# Patient Record
Sex: Female | Born: 1960 | Race: Black or African American | Hispanic: No | Marital: Single | Smoking: Current every day smoker
Health system: Southern US, Community
[De-identification: ages and names within clinical notes are randomized; demographics above are authoritative.]

## PROBLEM LIST (undated history)

## (undated) ENCOUNTER — Emergency Department (HOSPITAL_COMMUNITY): Discharge status: Absent without leave | Payer: Self-pay

## (undated) DIAGNOSIS — I10 Essential (primary) hypertension: Secondary | ICD-10-CM

## (undated) DIAGNOSIS — T7840XA Allergy, unspecified, initial encounter: Secondary | ICD-10-CM

## (undated) HISTORY — PX: ABDOMINAL HYSTERECTOMY: SHX81

## (undated) HISTORY — DX: Allergy, unspecified, initial encounter: T78.40XA

---

## 2000-08-12 ENCOUNTER — Ambulatory Visit (HOSPITAL_COMMUNITY): Admission: RE | Admit: 2000-08-12 | Discharge: 2000-08-12 | Payer: Self-pay | Admitting: *Deleted

## 2000-09-25 ENCOUNTER — Encounter: Admission: RE | Admit: 2000-09-25 | Discharge: 2000-09-25 | Payer: Self-pay | Admitting: Obstetrics

## 2000-11-06 ENCOUNTER — Other Ambulatory Visit: Admission: RE | Admit: 2000-11-06 | Discharge: 2000-11-06 | Payer: Self-pay | Admitting: Obstetrics

## 2000-11-06 ENCOUNTER — Encounter: Admission: RE | Admit: 2000-11-06 | Discharge: 2000-11-06 | Payer: Self-pay | Admitting: Obstetrics

## 2000-11-14 ENCOUNTER — Ambulatory Visit (HOSPITAL_COMMUNITY): Admission: RE | Admit: 2000-11-14 | Discharge: 2000-11-14 | Payer: Self-pay | Admitting: *Deleted

## 2002-08-24 ENCOUNTER — Other Ambulatory Visit: Admission: RE | Admit: 2002-08-24 | Discharge: 2002-08-24 | Payer: Self-pay | Admitting: *Deleted

## 2005-07-31 ENCOUNTER — Other Ambulatory Visit: Admission: RE | Admit: 2005-07-31 | Discharge: 2005-07-31 | Payer: Self-pay | Admitting: *Deleted

## 2005-10-15 ENCOUNTER — Encounter (INDEPENDENT_AMBULATORY_CARE_PROVIDER_SITE_OTHER): Payer: Self-pay | Admitting: *Deleted

## 2005-10-16 ENCOUNTER — Inpatient Hospital Stay (HOSPITAL_COMMUNITY): Admission: RE | Admit: 2005-10-16 | Discharge: 2005-10-17 | Payer: Self-pay | Admitting: *Deleted

## 2006-02-04 ENCOUNTER — Ambulatory Visit (HOSPITAL_COMMUNITY): Admission: RE | Admit: 2006-02-04 | Discharge: 2006-02-04 | Payer: Self-pay | Admitting: *Deleted

## 2006-02-20 ENCOUNTER — Encounter: Admission: RE | Admit: 2006-02-20 | Discharge: 2006-02-20 | Payer: Self-pay | Admitting: *Deleted

## 2006-04-03 ENCOUNTER — Encounter: Admission: RE | Admit: 2006-04-03 | Discharge: 2006-04-03 | Payer: Self-pay | Admitting: *Deleted

## 2008-06-24 ENCOUNTER — Ambulatory Visit: Payer: Self-pay | Admitting: Family Medicine

## 2008-06-24 LAB — CONVERTED CEMR LAB
AST: 18 units/L (ref 0–37)
Basophils Absolute: 0 10*3/uL (ref 0.0–0.1)
Basophils Relative: 0 % (ref 0–1)
CO2: 24 meq/L (ref 19–32)
Chloride: 104 meq/L (ref 96–112)
Cholesterol: 231 mg/dL — ABNORMAL HIGH (ref 0–200)
Eosinophils Absolute: 0.1 10*3/uL (ref 0.0–0.7)
Eosinophils Relative: 2 % (ref 0–5)
Glucose, Bld: 85 mg/dL (ref 70–99)
HCT: 43.3 % (ref 36.0–46.0)
HDL: 65 mg/dL (ref >39)
LDL Cholesterol: 150 mg/dL — ABNORMAL HIGH (ref 0–99)
MCHC: 32.1 g/dL (ref 30.0–36.0)
MCV: 94.1 fL (ref 78.0–100.0)
Monocytes Absolute: 0.4 10*3/uL (ref 0.1–1.0)
Monocytes Relative: 8 % (ref 3–12)
Neutrophils Relative %: 61 % (ref 43–77)
Potassium: 4 meq/L (ref 3.5–5.3)
Sodium: 139 meq/L (ref 135–145)
TSH: 1.113 microintl units/mL (ref 0.350–4.50)
WBC: 4.5 10*3/uL (ref 4.0–10.5)

## 2008-06-28 ENCOUNTER — Ambulatory Visit (HOSPITAL_COMMUNITY): Admission: RE | Admit: 2008-06-28 | Discharge: 2008-06-28 | Payer: Self-pay | Admitting: Family Medicine

## 2008-07-01 ENCOUNTER — Ambulatory Visit: Payer: Self-pay | Admitting: *Deleted

## 2008-07-06 ENCOUNTER — Encounter: Admission: RE | Admit: 2008-07-06 | Discharge: 2008-07-06 | Payer: Self-pay | Admitting: Family Medicine

## 2008-08-30 ENCOUNTER — Ambulatory Visit: Payer: Self-pay | Admitting: Family Medicine

## 2008-08-30 ENCOUNTER — Encounter (INDEPENDENT_AMBULATORY_CARE_PROVIDER_SITE_OTHER): Payer: Self-pay | Admitting: Family Medicine

## 2009-03-05 ENCOUNTER — Emergency Department (HOSPITAL_COMMUNITY): Admission: EM | Admit: 2009-03-05 | Discharge: 2009-03-05 | Payer: Self-pay | Admitting: Emergency Medicine

## 2009-05-09 ENCOUNTER — Emergency Department (HOSPITAL_COMMUNITY): Admission: EM | Admit: 2009-05-09 | Discharge: 2009-05-09 | Payer: Self-pay | Admitting: Emergency Medicine

## 2009-07-29 ENCOUNTER — Emergency Department (HOSPITAL_COMMUNITY): Admission: EM | Admit: 2009-07-29 | Discharge: 2009-07-29 | Payer: Self-pay | Admitting: Emergency Medicine

## 2009-08-21 ENCOUNTER — Ambulatory Visit (HOSPITAL_COMMUNITY): Admission: RE | Admit: 2009-08-21 | Discharge: 2009-08-21 | Payer: Self-pay | Admitting: Family Medicine

## 2009-12-13 ENCOUNTER — Emergency Department (HOSPITAL_COMMUNITY): Admission: EM | Admit: 2009-12-13 | Discharge: 2009-12-13 | Payer: Self-pay | Admitting: Family Medicine

## 2010-01-29 ENCOUNTER — Emergency Department (HOSPITAL_COMMUNITY): Admission: EM | Admit: 2010-01-29 | Discharge: 2010-01-29 | Payer: Self-pay | Admitting: Emergency Medicine

## 2010-09-04 ENCOUNTER — Ambulatory Visit (HOSPITAL_COMMUNITY): Admission: RE | Admit: 2010-09-04 | Discharge: 2010-09-04 | Payer: Self-pay | Admitting: Internal Medicine

## 2011-02-24 LAB — POCT I-STAT, CHEM 8
BUN: 14 mg/dL (ref 6–23)
Potassium: 3.8 mEq/L (ref 3.5–5.1)
Sodium: 139 mEq/L (ref 135–145)
TCO2: 30 mmol/L (ref 0–100)

## 2011-02-27 LAB — WET PREP, GENITAL
Clue Cells Wet Prep HPF POC: NONE SEEN
Trich, Wet Prep: NONE SEEN

## 2011-04-26 NOTE — Discharge Summary (Signed)
NAMESHRINIKA, Stacey Fuller                   ACCOUNT NO.:  [id number]   MEDICAL RECORD NO.:  [id number]          PATIENT TYPE:  OIB   LOCATION:  1605                         FACILITY:  [facility]   PHYSICIAN:  Almedia Balls. Fore, M.D.   DATE OF BIRTH:  [birth year]   DATE OF ADMISSION:  10/15/2005  DATE OF DISCHARGE:  10/17/2005                                 DISCHARGE SUMMARY   HISTORY OF PRESENT ILLNESS:  The patient is a 50 year old with leiomyomata  uteri, abnormal bleeding and pelvic pain for hysterectomy on November 7.  The remainder of her History and Physical are as previously dictated.   LABORATORY DATA AND X-RAY FINDINGS:  Preoperative hemoglobin of 10.3.  Serum  pregnancy test negative.   EKG showed nonspecific T waves, but otherwise normal.   HOSPITAL COURSE:  The patient was taken to the operating room on October 15, 2005, at which time TAH/LSO, extensive enterolysis, revision of keloid scar  of the abdomen were performed.  The patient did well postoperatively.  Diet  and ambulation were progressed over several days postoperatively.  On the  morning of October 17, 2005, she was afebrile and experiencing no problems  except for pain which was controlled by oral analgesics.  It was felt that  she could be discharged at this time.   DISCHARGE DIAGNOSES:  1.  Leiomyomata uteri with abnormal bleeding secondary to above.  2.  Pelvic pain.  3.  Hemorrhagic ovarian cyst.  4.  Pelvic adhesions.  5.  Abdominal wall keloid.   PROCEDURE:  Total abdominal hysterectomy/left salpingo-oophorectomy with  extensive enterolysis and revision of keloid scar.  Pathology report showed  uterus with cervix with chronic cervicitis, squamous metaplasia, endometrium  proliferative, multiple myelomata with some adenomyosis, erosive with  adhesions.  Left ovary was benign with adhesions.  Left tube benign as well.   DISPOSITION:  Discharged to home.   FOLLOW UP:  Return to the office in 2 weeks for follow-up  appointment.   ACTIVITY:  She was instructed to gradually progress her activities over  several weeks at home and to limit lifting and driving x2 weeks.  She was  fully ambulatory.   DIET:  Regular.   CONDITION ON DISCHARGE:  Good.   DISCHARGE MEDICATIONS:  1.  Percocet 10/325, #30, to be taken 1-2 q.6h. p.r.n. pain.  2.  Doxycycline 100 mg, #12, to be taken one b.i.d.           ______________________________  Almedia Balls. Randell Patient, M.D.     SRF/MEDQ  D:  10/17/2005  T:  10/17/2005  Job:  [id number]

## 2011-04-26 NOTE — Discharge Summary (Signed)
Stacey Fuller, Stacey Fuller                   ACCOUNT NO.:  [id number]   MEDICAL RECORD NO.:  [id number]          PATIENT TYPE:  INP   LOCATION:  1605                         FACILITY:  [facility]   PHYSICIAN:  Almedia Balls. Fore, M.D.   DATE OF BIRTH:  09/19/1961   DATE OF ADMISSION:  10/15/2005  DATE OF DISCHARGE:  10/17/2005                                 DISCHARGE SUMMARY   HISTORY:  The patient is a 50 year old with leiomyomata uteri, abnormal  uterine bleeding, pelvic pain for hysterectomy on November 7. The remainder  of her History and Physical are as previously dictated.   LABORATORY DATA:  Preoperative hemoglobin 10.5.   HOSPITAL COURSE:  The patient was taken to the operating on November 7 at  which time abdominal supracervical hysterectomy, left salpingo-oophorectomy,  enterolysis, revision of keloid scar were performed. The patient did well  over several days postoperatively. Diet and ambulation were progressed over  the day on November 8. On the morning of November 9, she was afebrile and  experiencing no problems, so it was felt that she could be discharged at  this time.   FINAL DIAGNOSES:  1.  Abnormal uterine bleeding.  2.  Pelvic pain.  3.  Pelvic adhesions.  4.  Keloid scar.   OPERATION:  1.  Abdominal hysterectomy.  2.  Left salpingo-oophorectomy.  3.  Extensive enterolysis.  4.  Revision of keloid scar.   Pathology report showed chronic cervicitis with squamous metaplasia,  endometrium with proliferative changes, myometrium multiple leiomyomata,  adenomyosis and serosa with adhesions.   DISPOSITION:  Discharged home to return to the office in two weeks for  follow-up. She was instructed to gradually progress her activities over  several weeks at home and to limit lifting and driving for two weeks. She  was fully ambulatory, on a regular diet, and in good condition at the time  of discharge. She was given prescriptions for Percocet, generic 10/325 #30  to be taken 1  every 6 hours p.r.n. pain and doxycycline 100 mg #12 to be  taken 1 twice daily.           ______________________________  Almedia Balls Randell Patient, M.D.     SRF/MEDQ  D:  11/13/2005  T:  11/13/2005  Job:  [id number]

## 2011-04-26 NOTE — Op Note (Signed)
Stacey Fuller, Stacey Fuller                   ACCOUNT NO.:  [id number]   MEDICAL RECORD NO.:  [id number]          PATIENT TYPE:  AMB   LOCATION:  DAY                          FACILITY:  [facility]   PHYSICIAN:  Almedia Balls. Fore, M.D.   DATE OF BIRTH:  [birth year]   DATE OF PROCEDURE:  10/15/2005  DATE OF DISCHARGE:                                 OPERATIVE REPORT   PREOPERATIVE DIAGNOSES:  1.  Abnormal uterine bleeding.  2.  Pelvic pain.  3.  Uterine enlargement - leiomyomata uteri.   POSTOPERATIVE DIAGNOSES:  1.  Abnormal uterine bleeding.  2.  Pelvic pain.  3.  Uterine enlargement - leiomyomata uteri.  4.  Pending pathology plus multiple pelvic adhesions.   OPERATION:  1.  Total abdominal hysterectomy.  2.  Left salpingo-oophorectomy.  3.  Extensive enterolysis.   ANESTHESIA:  General orotracheal.   OPERATOR:  Dr. Randell Patient.   FIRST ASSISTANT:  Dr. Laureen Ochs   INDICATIONS FOR SURGERY:  The patient is a 50 year old with the above-noted  problems, who was counseled as to the need for surgery and the type surgery  to be performed.  She was fully counseled as to the nature of the procedure  and the risks involved to include risks of anesthesia, injury to bowel,  bladder, blood vessels, ureters, postoperative hemorrhage, infection,  recuperation and possible hormone replacement should her ovaries be removed.  She fully understands all these considerations and wishes to proceed on 15 October 2005.   OPERATIVE FINDINGS:  On entry into the peritoneal cavity, there were  multiple adhesions involving loops of bowel to the anterior peritoneal  surface and to each other and to the uterine fundus.  The uterine fundus was  enlarged to approximately 20 weeks' gestational size.  There were multiple  cysts on the left ovary, some of which were hemorrhagic.  The right ovary  had a corpus luteum present.  There were adhesions in the anterior surface  of the uterus to the bladder as well.   PROCEDURE:  With  the patient under general anesthesia, prepared and draped  in the usual sterile fashion with a Foley catheter in the bladder, a lower  abdominal transverse incision was made after excising a previously scarred  incision.  This was carried into the peritoneal cavity with care to avoid  injury to bowel below the incision.  Bleeders were rendered hemostatic with  interrupted sutures of 1 chromic catgut and/or a Bovie electrocoagulation.  It was necessary to extensively lyse the adhesions to the anterior  peritoneal surface before entry into the peritoneum.  Adhesions to the  uterus were likewise lysed as well as adhesions to the bowel.  This was done  carefully with bleeders being rendered hemostatic with Bovie  electrocoagulation.  It was then possible to elevate the uterine fundus  through the incision.  Heaney clamps were placed across the utero-ovarian  anastomoses, tubes, and round ligaments bilaterally.  The structures were  then doubly clamped and cut free and doubly tied with 1 chromic catgut.  Round ligaments were then identified, divided using Bovie  electrocoagulation, and then the uterine vessels were skeletonized.  Heaney  clamps were placed across the uterine vessels bilaterally; the uterine  fundus was then excised for better visualization and technical ease.  The  bladder had been advanced over the lower uterine segment very carefully down  to the level of the cervix.  The bladder was further advanced over the lower  uterine segment and cervix, and the suture of the uterine vessels were then  suture ligated bilaterally with 1 chromic catgut.  Heaney clamps were used  to clamp in succession the cardinal ligaments and uterosacral ligaments  bilaterally; these structures were cut free and individually suture ligated  with 1 chromic catgut.  It was possible to core out the cervix using Bovie  electrocoagulation, leaving just a rim of cervical tissue.  This amount of  cervix was  reapproximated and rendered hemostatic with interrupted figure-of-  eight sutures of 1 chromic catgut.  Attention was directed to the left  ovary, and it was noted that there was bleeding coming from the  infundibulopelvic ligament vessels underlying the ovary.  Accordingly, the  retroperitoneal space was opened further with identification of the  infundibulopelvic ligament.  This was well above the course of the ureter,  and the ligament was then clamped using Heaney clamp with excision of the  left tube and ovary.  This pedicle of the infundibulopelvic ligament was  then doubly tied with 1 chromic catgut.  Small bleeders in the area of the  right ovary were rendered hemostatic with Bovie electrocoagulation with a  piece of Surgicel cotton being placed for further hemostasis.  A piece of  Surgicel was placed in the area of the left ovary for small ooze hemostasis  as well.  The area was lavaged with copious amounts of lactated Ringer  solution and after noting that hemostasis was maintained and the sponge and  instruments counts were correct, the peritoneum was closed with a continuous  suture of 0 Vicryl.  Fascia was closed with 2 sutures of 0 Vicryl which were  brought from the lateral aspects of the incision and tied separately in the  midline.  Subcutaneous fat was reapproximated with horizontal mattress  sutures in an interrupted fashion of 0 Vicryl.  Skin was closed with a  subcuticular suture of 3-0 plain catgut.  Estimated blood loss 600 mL.  The  patient was taken to the recovery room in good condition with clear urine  and the Foley catheter tubing.  She will be placed on 23-hour observation  following surgery.           ______________________________  Almedia Balls Randell Patient, M.D.     SRF/MEDQ  D:  10/15/2005  T:  10/15/2005  Job:  [id number]   cc:   Leona Singleton, M.D.  Fax: [phone]

## 2011-04-26 NOTE — H&P (Signed)
Stacey Fuller, SPIKES                   ACCOUNT NO.:  [id number]   MEDICAL RECORD NO.:  [id number]          PATIENT TYPE:   LOCATION:                                 FACILITY:   PHYSICIAN:  Almedia Balls. Fore, M.D.        DATE OF BIRTH:   DATE OF ADMISSION:  10/15/2005  DATE OF DISCHARGE:                                HISTORY & PHYSICAL   CHIEF COMPLAINTS:  Pain, abnormal bleeding, heavy periods.   HISTORY:  The patient is a 50 year old, gravida 3, para 2 whose last  menstrual period was 24 October, 2006. She was initially seen in 1995 and  then was not seen again until 2001  at which time she was noted to have a  uterus somewhat enlarged. She was seen again  in 2002 with uterus quite  enlarged and subsequent exams revealed the uterus to be enlarging to the  point where it is approximately [redacted] weeks gestational size. She had a normal  Pap smear in August and normal endometrial biopsy in September, 2006. Recent  hemoglobin was 9.8 with obvious iron deficiency anemia present secondary to  blood loss with her menses very probably. She is admitted at this time for  TAH, possible BSO. She has been fully counseled as to the nature of the  procedure and the risks involved include risks of anesthesia, injury to  bowel, bladder, blood vessels, ureters, postoperative hemorrhage, infection,  recuperation possible hormone replacement should her ovaries be removed. She  fully all these considerations and wishes to proceed on October 15, 2005.   PAST MEDICAL HISTORY:  Includes C-section for delivery of children in 1984  and 1986.   ALLERGIES:  No known drug allergies.   MEDICATIONS:  She has taken only iron and progestational agents  intermittently for control of her menses without success.   FAMILY HISTORY:  Noncontributory.   REVIEW OF SYSTEMS:  HEENT:  Negative. CARDIORESPIRATORY: Negative.  GASTROINTESTINAL: Negative. GENITOURINARY: As in present illness.  NEUROMUSCULAR: Negative.   PHYSICAL  EXAMINATION:  VITAL SIGNS: Height 5 feet, 201/4 inches, weight 178  pounds. Blood pressure 118/70, pulse 80, respirations 18.  GENERAL: Well-developed black female in no acute distress.  HEENT: Within normal limits.  NECK:  Supple without masses, adenopathy or bruits.  HEART: Regular rate and rhythm without murmurs.  LUNGS:  Clear to P&A.  BREASTS:  Examined sitting and lying without mass. Axilla negative.  ABDOMEN:  Flat and soft with mass effect to the umbilicus which is somewhat  tender. PELVIC EXAMINATION:  External genitalia, Bartholin's, urethra and  Skene's glands within normal limits. Vagina is clean. Cervix slightly  inflamed. Uterus mid position, approximately [redacted] weeks gestational size,  irregular, somewhat tender. It is not possible to palpate the adnexa.  Ultrasound revealed the ovaries to appear normal in September, 2006.  Anterior and posterior cul-de-sac exam is confirmatory.  EXTREMITIES:  Within normal limits.  CENTRAL NERVOUS SYSTEM:  Grossly intact.  SKIN: Without suspicious lesions.   IMPRESSION:  Leiomyomata uteri, possible adenomyosis, abnormal uterine  bleeding, pelvic pain.   DISPOSITION:  As noted above.           ______________________________  Almedia Balls. Randell Patient, M.D.     SRF/MEDQ  D:  10/09/2005  T:  10/09/2005  Job:  [id number]

## 2011-08-04 ENCOUNTER — Inpatient Hospital Stay (INDEPENDENT_AMBULATORY_CARE_PROVIDER_SITE_OTHER)
Admission: RE | Admit: 2011-08-04 | Discharge: 2011-08-04 | Disposition: A | Discharge status: Home / Self Care | Payer: Self-pay | Source: Ambulatory Visit | Attending: Family Medicine | Admitting: Family Medicine

## 2011-08-04 DIAGNOSIS — N760 Acute vaginitis: Secondary | ICD-10-CM

## 2011-08-04 LAB — POCT URINALYSIS DIP (DEVICE)
Bilirubin Urine: NEGATIVE
Glucose, UA: NEGATIVE mg/dL
Leukocytes, UA: NEGATIVE
Nitrite: NEGATIVE
Protein, ur: NEGATIVE mg/dL
Specific Gravity, Urine: >=1.03 (ref 1.005–1.030)
Urobilinogen, UA: 0.2 mg/dL (ref 0.0–1.0)
pH: 5.5 (ref 5.0–8.0)

## 2011-08-04 LAB — WET PREP, GENITAL: Trich, Wet Prep: NONE SEEN

## 2011-10-13 ENCOUNTER — Emergency Department (INDEPENDENT_AMBULATORY_CARE_PROVIDER_SITE_OTHER)
Admission: EM | Admit: 2011-10-13 | Discharge: 2011-10-13 | Disposition: A | Discharge status: Home / Self Care | Payer: Self-pay | Source: Home / Self Care | Attending: Family Medicine | Admitting: Family Medicine

## 2011-10-13 DIAGNOSIS — A499 Bacterial infection, unspecified: Secondary | ICD-10-CM

## 2011-10-13 DIAGNOSIS — N760 Acute vaginitis: Secondary | ICD-10-CM

## 2011-10-13 DIAGNOSIS — B9689 Other specified bacterial agents as the cause of diseases classified elsewhere: Secondary | ICD-10-CM

## 2011-10-13 LAB — WET PREP, GENITAL
Trich, Wet Prep: NONE SEEN
Yeast Wet Prep HPF POC: NONE SEEN

## 2011-10-13 MED ORDER — METRONIDAZOLE 0.75 % VA GEL: 1.0000 | Freq: Every day | VAGINAL | Status: AC

## 2011-10-13 NOTE — ED Provider Notes (Signed)
History     CSN: [id number] Arrival date & time: 10/13/2011  2:56 PM   First MD Initiated Contact with Patient 10/13/11 1608      Chief Complaint  Patient presents with  . Vaginal Discharge    c/o vaginal discharge and odor with urination started 3 weeks ago  . Abdominal Cramping    (Consider location/radiation/quality/duration/timing/severity/associated sxs/prior treatment) HPI Comments: Foul smelling vaginal disharge x 1 week. Similar to previous BV. Also has odor with urination and dysuria first void in morning. MIld lower abd discomfort intermittently. BMs normal. No N/V/D or fever.   Patient is a 50 y.o. female presenting with vaginal discharge and cramps. The history is provided by the patient.  Vaginal Discharge This is a new problem. Episode onset: one week ago. The problem occurs constantly. The problem has not changed since onset.Associated symptoms include abdominal pain. Pertinent negatives include no chest pain and no shortness of breath. The symptoms are aggravated by nothing. The symptoms are relieved by nothing. She has tried nothing for the symptoms.  Abdominal Cramping The primary symptoms of the illness include abdominal pain, dysuria and vaginal discharge. The primary symptoms of the illness do not include fever, shortness of breath or vaginal bleeding.  The dysuria is associated with urgency. The dysuria is not associated with frequency.  The vaginal discharge is associated with dysuria.  Additional symptoms associated with the illness include urgency. Symptoms associated with the illness do not include chills or frequency.    History reviewed. No pertinent past medical history.  Past Surgical History  Procedure Date  . Abdominal hysterectomy   . Cesarean section     Family History  Problem Relation Age of Onset  . Cancer Sister     History  Substance Use Topics  . Smoking status: Current Everyday Smoker  . Smokeless tobacco: Not on file  . Alcohol  Use: Yes    OB History    Grav Para Term Preterm Abortions TAB SAB Ect Mult Living                  Review of Systems  Constitutional: Negative for fever and chills.  Respiratory: Negative for cough and shortness of breath.   Cardiovascular: Negative for chest pain.  Gastrointestinal: Positive for abdominal pain.  Genitourinary: Positive for dysuria, urgency and vaginal discharge. Negative for frequency and vaginal bleeding.    Allergies  Review of patient's allergies indicates no known allergies.  Home Medications   Current Outpatient Rx  Name Route Sig Dispense Refill  . METRONIDAZOLE 0.75 % VA GEL Vaginal Place 1 Applicatorful vaginally at bedtime. 70 g 0    BP 135/82  Pulse 83  Temp(Src) 98.2 F (36.8 C) (Oral)  Resp 19  SpO2 100%  Physical Exam  Constitutional: She appears well-developed and well-nourished. No distress.  HENT:  Head: Normocephalic and atraumatic.  Cardiovascular: Normal rate, regular rhythm and normal heart sounds.  Exam reveals no gallop and no friction rub.   No murmur heard. Pulmonary/Chest: Effort normal and breath sounds normal. No respiratory distress.  Abdominal: Soft. Bowel sounds are normal. She exhibits no distension and no mass. There is no tenderness.  Genitourinary: There is no rash or lesion on the right labia. There is no rash or lesion on the left labia. Cervix exhibits no motion tenderness, no discharge and no friability. Right adnexum displays no mass, no tenderness and no fullness. Left adnexum displays no mass, no tenderness and no fullness. No erythema or bleeding around the  vagina. No signs of injury around the vagina. Vaginal discharge (small amount of thin watery dischg) found.       Cervix present, but uterus surgically absent.  Neurological: She is alert.  Skin: Skin is warm and dry.  Psychiatric: She has a normal mood and affect.    ED Course  Procedures (including critical care time)   Labs Reviewed  POCT  URINALYSIS DIPSTICK  GC/CHLAMYDIA PROBE AMP, GENITAL  WET PREP, GENITAL   No results found.   1. Bacterial vaginosis       MDM  UA reviewed. Wet prep and GC/Chlaymdia pending.         Melody Comas, [place] 10/13/11 1710

## 2011-10-13 NOTE — ED Provider Notes (Signed)
Medical screening examination/treatment/procedure(s) were performed by non-physician practitioner and as supervising physician I was immediately available for consultation/collaboration.  Randa Spike, MD 10/13/11 2111

## 2011-10-14 LAB — POCT URINALYSIS DIP (DEVICE)
Bilirubin Urine: NEGATIVE
Specific Gravity, Urine: >=1.03 (ref 1.005–1.030)
pH: 6 (ref 5.0–8.0)

## 2011-10-14 LAB — GC/CHLAMYDIA PROBE AMP, GENITAL
Chlamydia, DNA Probe: NEGATIVE
GC Probe Amp, Genital: NEGATIVE

## 2011-10-15 NOTE — ED Notes (Signed)
GC neg., Chlamydia neg., Wet prep: Mod. Clue cells, mod. WBC's. The patient was adequately treated with Metrogel.

## 2011-12-13 ENCOUNTER — Other Ambulatory Visit: Payer: Self-pay | Admitting: Infectious Diseases

## 2011-12-13 DIAGNOSIS — Z1231 Encounter for screening mammogram for malignant neoplasm of breast: Secondary | ICD-10-CM

## 2012-02-03 ENCOUNTER — Ambulatory Visit (HOSPITAL_COMMUNITY)
Admission: RE | Admit: 2012-02-03 | Discharge: 2012-02-03 | Disposition: A | Discharge status: Home / Self Care | Payer: Self-pay | Source: Ambulatory Visit | Attending: Infectious Diseases | Admitting: Infectious Diseases

## 2012-02-03 DIAGNOSIS — Z1231 Encounter for screening mammogram for malignant neoplasm of breast: Secondary | ICD-10-CM

## 2013-01-17 ENCOUNTER — Emergency Department (HOSPITAL_COMMUNITY)
Admission: EM | Admit: 2013-01-17 | Discharge: 2013-01-17 | Disposition: A | Discharge status: Home / Self Care | Payer: Self-pay | Attending: Emergency Medicine | Admitting: Emergency Medicine

## 2013-01-17 ENCOUNTER — Emergency Department (HOSPITAL_COMMUNITY): Payer: Self-pay

## 2013-01-17 ENCOUNTER — Encounter (HOSPITAL_COMMUNITY): Payer: Self-pay | Admitting: *Deleted

## 2013-01-17 DIAGNOSIS — R079 Chest pain, unspecified: Secondary | ICD-10-CM | POA: Insufficient documentation

## 2013-01-17 DIAGNOSIS — F17200 Nicotine dependence, unspecified, uncomplicated: Secondary | ICD-10-CM | POA: Insufficient documentation

## 2013-01-17 DIAGNOSIS — R030 Elevated blood-pressure reading, without diagnosis of hypertension: Secondary | ICD-10-CM | POA: Insufficient documentation

## 2013-01-17 DIAGNOSIS — E785 Hyperlipidemia, unspecified: Secondary | ICD-10-CM | POA: Insufficient documentation

## 2013-01-17 LAB — CBC WITH DIFFERENTIAL/PLATELET
Basophils Absolute: 0 10*3/uL (ref 0.0–0.1)
Basophils Relative: 0 % (ref 0–1)
Eosinophils Absolute: 0.1 10*3/uL (ref 0.0–0.7)
HCT: 40 % (ref 36.0–46.0)
MCH: 29.7 pg (ref 26.0–34.0)
MCHC: 33.5 g/dL (ref 30.0–36.0)
Monocytes Absolute: 0.4 10*3/uL (ref 0.1–1.0)
Neutro Abs: 2.7 10*3/uL (ref 1.7–7.7)
Neutrophils Relative %: 54 % (ref 43–77)
RDW: 13.1 % (ref 11.5–15.5)

## 2013-01-17 LAB — COMPREHENSIVE METABOLIC PANEL
AST: 18 U/L (ref 0–37)
Albumin: 3.6 g/dL (ref 3.5–5.2)
Chloride: 98 mEq/L (ref 96–112)
Creatinine, Ser: 0.72 mg/dL (ref 0.50–1.10)
Total Bilirubin: 0.2 mg/dL — ABNORMAL LOW (ref 0.3–1.2)
Total Protein: 7.4 g/dL (ref 6.0–8.3)

## 2013-01-17 LAB — POCT I-STAT TROPONIN I: Troponin i, poc: 0.01 ng/mL (ref 0.00–0.08)

## 2013-01-17 LAB — LIPASE, BLOOD: Lipase: 18 U/L (ref 11–59)

## 2013-01-17 MED ORDER — PANTOPRAZOLE SODIUM 40 MG IV SOLR
40.0000 mg | Freq: Once | INTRAVENOUS | Status: AC
  Administered 2013-01-17: 40 mg via INTRAVENOUS
  Filled 2013-01-17: qty 40

## 2013-01-17 MED ORDER — ASPIRIN 81 MG PO CHEW
324.0000 mg | CHEWABLE_TABLET | Freq: Once | ORAL | Status: AC
  Administered 2013-01-17: 324 mg via ORAL
  Filled 2013-01-17: qty 4

## 2013-01-17 MED ORDER — GI COCKTAIL ~~LOC~~
30.0000 mL | Freq: Once | ORAL | Status: AC
  Administered 2013-01-17: 30 mL via ORAL
  Filled 2013-01-17: qty 30

## 2013-01-17 NOTE — ED Notes (Signed)
Pt c/o mid sternal chest pain x 2 days; increased pain with movement; constant; pain under right shoulder blade; c/o frequent belching; states pain feels like someone standing on her chest; today increased fatigue and shortness of breath; dizziness

## 2013-01-17 NOTE — ED Notes (Signed)
Pt states that she has pressure in her epigastric region. Pain does not radiate. Pt is burping forcefully and frequently. Pt also c/o back pain under her shoulder blade. Believes pain is muscular. Ice pack provided.

## 2013-01-17 NOTE — ED Provider Notes (Signed)
History     CSN: [id number]  Arrival date & time 01/17/13  2013   First MD Initiated Contact with Patient 01/17/13 2026     Chief Complain: Chest Pain  (Consider location/radiation/quality/duration/timing/severity/associated sxs/prior treatment) The history is provided by the patient.   52 year old female comes in with a three-day history of pain in the lower sternal area which radiates through to the interscapular area. Pain is described as a heavy feeling it is moderately severe at 8/10. It is worse with movement and deep breathing. It is better she sits up straight. There is associated belching but denies nausea or vomiting or dyspnea or diaphoresis. She is a cigarette smoker but states that she stopped smoking when she started having this discomfort. She has tried taking TUMS which do give temporary relief. She has a history of hyperlipidemia but no history of diabetes or hypertension.  History reviewed. No pertinent past medical history.  Past Surgical History  Procedure Laterality Date  . Abdominal hysterectomy    . Cesarean section      Family History  Problem Relation Age of Onset  . Cancer Sister     History  Substance Use Topics  . Smoking status: Current Every Day Smoker  . Smokeless tobacco: Not on file  . Alcohol Use: Yes    OB History   Grav Para Term Preterm Abortions TAB SAB Ect Mult Living                  Review of Systems  All other systems reviewed and are negative.    Allergies  Review of patient's allergies indicates no known allergies.  Home Medications  No current outpatient prescriptions on file.  BP 152/79  Pulse 94  Temp(Src) 98.4 F (36.9 C) (Oral)  Resp 18  Ht 5\' 1"  (1.549 m)  Wt 209 lb (94.802 kg)  BMI 39.51 kg/m2  SpO2 98%  Physical Exam  Nursing note and vitals reviewed.  52 year old female, resting comfortably and in no acute distress. Vital signs are significant for hypertension with blood pressure 152/79. Oxygen  saturation is 98%, which is normal. Head is normocephalic and atraumatic. PERRLA, EOMI. Oropharynx is clear. Neck is nontender and supple without adenopathy or JVD. Back is nontender and there is no CVA tenderness. Lungs are clear without rales, wheezes, or rhonchi. Chest has moderate tenderness over the lower sternal area which does reproduce her pain. Heart has regular rate and rhythm with a 2/6 systolic ejection murmur along the left sternal border. Abdomen is soft, flat, nontender without masses or hepatosplenomegaly and peristalsis is normoactive. Extremities have no cyanosis or edema, full range of motion is present. Skin is warm and dry without rash. Neurologic: Mental status is normal, cranial nerves are intact, there are no motor or sensory deficits.  ED Course  Procedures (including critical care time)  Labs Reviewed  CBC WITH DIFFERENTIAL  COMPREHENSIVE METABOLIC PANEL  LIPASE, BLOOD   No results found.   Date: 01/17/2013  Rate: 66  Rhythm: sinus arrhythmia  QRS Axis: normal  Intervals: normal  ST/T Wave abnormalities: nonspecific T wave changes  Conduction Disutrbances:none  Narrative Interpretation: Sinus arrhythmia, minor nonspecific T wave flattening. When compared with ECG of 10/15/2005, no significant changes are seen.  Old EKG Reviewed: unchanged    1. Chest pain       MDM   To be due 2 GERD although she does have cardiac risk factors of smoking and hyperlipidemia and blood pressure is elevated on exam  today. She'll be given a trial of a GI cocktail and IV pantoprazole and she is also given initial dose of aspirin.chest pain which seems most likely to be due GERD.  She got a sliver leaf of pain with a GI cocktail and IV pantoprazole. She is referred to [facility] to arrange an outpatient stress test. She states that she stopped smoking when she started having her symptoms. She is encouraged to continue to not smoke. She is advised to take  over-the-counter omeprazole and return if symptoms worsen.        Dione Booze, MD 01/17/13 [phone]

## 2013-03-04 ENCOUNTER — Encounter (HOSPITAL_COMMUNITY): Payer: Self-pay | Admitting: Emergency Medicine

## 2013-03-04 ENCOUNTER — Emergency Department (HOSPITAL_COMMUNITY)
Admission: EM | Admit: 2013-03-04 | Discharge: 2013-03-04 | Disposition: A | Discharge status: Home / Self Care | Payer: Self-pay | Attending: Emergency Medicine | Admitting: Emergency Medicine

## 2013-03-04 DIAGNOSIS — F410 Panic disorder [episodic paroxysmal anxiety] without agoraphobia: Secondary | ICD-10-CM | POA: Insufficient documentation

## 2013-03-04 DIAGNOSIS — F17200 Nicotine dependence, unspecified, uncomplicated: Secondary | ICD-10-CM | POA: Insufficient documentation

## 2013-03-04 DIAGNOSIS — R42 Dizziness and giddiness: Secondary | ICD-10-CM

## 2013-03-04 DIAGNOSIS — I10 Essential (primary) hypertension: Secondary | ICD-10-CM | POA: Insufficient documentation

## 2013-03-04 LAB — POCT I-STAT, CHEM 8
Chloride: 104 mEq/L (ref 96–112)
Glucose, Bld: 103 mg/dL — ABNORMAL HIGH (ref 70–99)
HCT: 41 % (ref 36.0–46.0)
Hemoglobin: 13.9 g/dL (ref 12.0–15.0)
Potassium: 3.8 mEq/L (ref 3.5–5.1)
Sodium: 138 mEq/L (ref 135–145)

## 2013-03-04 NOTE — ED Provider Notes (Signed)
Medical screening examination/treatment/procedure(s) were performed by non-physician practitioner and as supervising physician I was immediately available for consultation/collaboration.  Glynn Octave, MD 03/04/13 [phone]

## 2013-03-04 NOTE — ED Provider Notes (Signed)
History     CSN: [id number]  Arrival date & time 03/04/13  0135   First MD Initiated Contact with Patient 03/04/13 0210      Chief Complaint  Patient presents with  . Hypertension    (Consider location/radiation/quality/duration/timing/severity/associated sxs/prior treatment) HPI History provided by pt.   Healthy 52yo F presents w/ 10 minute episode of lightheadedness that occurred while doing laundry yesterday evening.  Shortly after onset, she developed a typical panic attack, sx including severe anxiousness, tachycardia and hot/cold flashes.  She has not experienced CP or SOB.  No recent illnesses including fever, cough, vomiting, diarrhea or urinary sx.  No LE edema/pain.  No headache, vision changes, extremity weakness/paresthesias.   History reviewed. No pertinent past medical history.  Past Surgical History  Procedure Laterality Date  . Abdominal hysterectomy    . Cesarean section      Family History  Problem Relation Age of Onset  . Cancer Sister     History  Substance Use Topics  . Smoking status: Current Every Day Smoker  . Smokeless tobacco: Not on file  . Alcohol Use: Yes    OB History   Grav Para Term Preterm Abortions TAB SAB Ect Mult Living                  Review of Systems  All other systems reviewed and are negative.    Allergies  Review of patient's allergies indicates no known allergies.  Home Medications   Current Outpatient Rx  Name  Route  Sig  Dispense  Refill  . naproxen sodium (ALEVE) 220 MG tablet   Oral   Take 220 mg by mouth 2 (two) times daily as needed. For joint pain           BP 145/88  Pulse 84  Temp(Src) 98.7 F (37.1 C) (Oral)  Resp 18  Ht 5\' 2"  (1.575 m)  Wt 220 lb (99.791 kg)  BMI 40.23 kg/m2  SpO2 98%  Physical Exam  Nursing note and vitals reviewed. Constitutional: She is oriented to person, place, and time. She appears well-developed and well-nourished. No distress.  HENT:  Head: Normocephalic and  atraumatic.  Eyes:  Normal appearance  Neck: Normal range of motion.  Cardiovascular: Normal rate and regular rhythm.   No carotid bruit  Pulmonary/Chest: Effort normal and breath sounds normal. No respiratory distress.  Abdominal: Soft. Bowel sounds are normal. She exhibits no distension. There is no tenderness.  Musculoskeletal: Normal range of motion.  No LE edema/ttp  Neurological: She is alert and oriented to person, place, and time.  Skin: Skin is warm and dry. No rash noted.  Psychiatric: She has a normal mood and affect. Her behavior is normal.    ED Course  Procedures (including critical care time)   Date: 03/04/2013  Rate: 73  Rhythm: normal sinus rhythm  QRS Axis: normal  Intervals: normal  ST/T Wave abnormalities: normal  Conduction Disutrbances:none  Narrative Interpretation:   Old EKG Reviewed: unchanged   Labs Reviewed  POCT I-STAT, CHEM 8 - Abnormal; Notable for the following:    Glucose, Bld 103 (*)    All other components within normal limits   No results found.   1. Lightheadedness       MDM  51yo F presents w/ c/o lightheadedness w/out syncope this evening.  Developed panic attack shortly following onset, BP checked at that time and was elevated, prompting pt to come to ED.  Currently asx.  Afebrile, mildly hypertensive, well  hydrated and exam otherwise unremarkable.  EKG non-ischemic and NSR and Chem 8 nml.  Results discussed w/ pt and she has been reassured.  She has been up to bathroom since initial encounter and lightheadedness did not recur.  Will recheck VS and then d/c home w/ referral to [facility].  Return precautions discussed. 4:21 AM          Otilio Miu, PA-C 03/04/13 [id number]

## 2013-03-04 NOTE — ED Notes (Signed)
Pt states that she has no hx of HTN but started feeling lightheaded tonight and had her BP taken and it was 140/106. Pt admits to eating pork tonight. Slightly lightheaded right now. Denies CP/SOB.

## 2013-03-04 NOTE — ED Notes (Signed)
Labs being drawn at this time.

## 2013-05-02 ENCOUNTER — Ambulatory Visit (INDEPENDENT_AMBULATORY_CARE_PROVIDER_SITE_OTHER): Payer: BC Managed Care – PPO | Admitting: Family Medicine

## 2013-05-02 ENCOUNTER — Ambulatory Visit: Payer: BC Managed Care – PPO

## 2013-05-02 VITALS — BP 136/89 | HR 75 | Temp 98.0°F | Resp 16 | Ht 63.0 in | Wt 214.8 lb

## 2013-05-02 DIAGNOSIS — M25562 Pain in left knee: Secondary | ICD-10-CM

## 2013-05-02 DIAGNOSIS — Z0000 Encounter for general adult medical examination without abnormal findings: Secondary | ICD-10-CM

## 2013-05-02 DIAGNOSIS — I781 Nevus, non-neoplastic: Secondary | ICD-10-CM

## 2013-05-02 DIAGNOSIS — M25569 Pain in unspecified knee: Secondary | ICD-10-CM

## 2013-05-02 MED ORDER — PREDNISONE 20 MG PO TABS: ORAL_TABLET | ORAL | Status: DC

## 2013-05-02 NOTE — Progress Notes (Signed)
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  Also notes some aching in other joints  Adopted:  Two sisters had breast cancer  Aleve partially relieves the pain  Sig Negs:  No calf or medial thigh pain  Objective:  NAD Left Knee:  Pain with anterior drawer, FROM, nontender Extrem:  No edema or varicosities Skin: no rash  [facility] reading (PRIMARY) by  Dr. Milus Glazier: left knee:  No significant bony abn  Assessment:  Soft tissue inflammation  Plan:  Prednisone 20 mg 2 daily x 5.  Signed, Elvina Sidle, MD.

## 2013-05-02 NOTE — Patient Instructions (Signed)
Dr. Noland Fordyce and Dr. Shea Evans are gynecologists  [phone]  Colonoscopy A colonoscopy is an exam to evaluate your entire colon. In this exam, your colon is cleansed. A long fiberoptic tube is inserted through your rectum and into your colon. The fiberoptic scope (endoscope) is a long bundle of enclosed and very flexible fibers. These fibers transmit light to the area examined and send images from that area to your caregiver. Discomfort is usually minimal. You may be given a drug to help you sleep (sedative) during or prior to the procedure. This exam helps to detect lumps (tumors), polyps, inflammation, and areas of bleeding. Your caregiver may also take a small piece of tissue (biopsy) that will be examined under a microscope. LET YOUR CAREGIVER KNOW ABOUT:   Allergies to food or medicine.  Medicines taken, including vitamins, herbs, eyedrops, over-the-counter medicines, and creams.  Use of steroids (by mouth or creams).  Previous problems with anesthetics or numbing medicines.  History of bleeding problems or blood clots.  Previous surgery.  Other health problems, including diabetes and kidney problems.  Possibility of pregnancy, if this applies. BEFORE THE PROCEDURE   A clear liquid diet may be required for 2 days before the exam.  Ask your caregiver about changing or stopping your regular medications.  Liquid injections (enemas) or laxatives may be required.  A large amount of electrolyte solution may be given to you to drink over a short period of time. This solution is used to clean out your colon.  You should be present 60 minutes prior to your procedure or as directed by your caregiver. AFTER THE PROCEDURE   If you received a sedative or pain relieving medication, you will need to arrange for someone to drive you home.  Occasionally, there is a little blood passed with the first bowel movement. Do not be concerned. FINDING OUT THE RESULTS OF YOUR TEST Not all  test results are available during your visit. If your test results are not back during the visit, make an appointment with your caregiver to find out the results. Do not assume everything is normal if you have not heard from your caregiver or the medical facility. It is important for you to follow up on all of your test results. HOME CARE INSTRUCTIONS   It is not unusual to pass moderate amounts of gas and experience mild abdominal cramping following the procedure. This is due to air being used to inflate your colon during the exam. Walking or a warm pack on your belly (abdomen) may help.  You may resume all normal meals and activities after sedatives and medicines have worn off.  Only take over-the-counter or prescription medicines for pain, discomfort, or fever as directed by your caregiver. Do not use aspirin or blood thinners if a biopsy was taken. Consult your caregiver for medicine usage if biopsies were taken. SEEK IMMEDIATE MEDICAL CARE IF:   You have a fever.  You pass large blood clots or fill a toilet with blood following the procedure. This may also occur 10 to 14 days following the procedure. This is more likely if a biopsy was taken.  You develop abdominal pain that keeps getting worse and cannot be relieved with medicine. Document Released: 11/22/2000 Document Revised: 02/17/2012 Document Reviewed: 07/07/2008 [facility] Patient Information 2014 [place], [place].

## 2013-05-02 NOTE — Addendum Note (Signed)
Addended by: Elvina Sidle on: 05/02/2013 12:19 PM   Modules accepted: Orders

## 2013-06-11 ENCOUNTER — Ambulatory Visit (INDEPENDENT_AMBULATORY_CARE_PROVIDER_SITE_OTHER): Payer: BC Managed Care – PPO | Admitting: Physician Assistant

## 2013-06-11 VITALS — BP 132/92 | HR 66 | Temp 97.8°F | Resp 16 | Ht 62.0 in | Wt 213.2 lb

## 2013-06-11 DIAGNOSIS — H9203 Otalgia, bilateral: Secondary | ICD-10-CM

## 2013-06-11 DIAGNOSIS — R05 Cough: Secondary | ICD-10-CM

## 2013-06-11 DIAGNOSIS — H9209 Otalgia, unspecified ear: Secondary | ICD-10-CM

## 2013-06-11 DIAGNOSIS — J329 Chronic sinusitis, unspecified: Secondary | ICD-10-CM

## 2013-06-11 DIAGNOSIS — R059 Cough, unspecified: Secondary | ICD-10-CM

## 2013-06-11 MED ORDER — IPRATROPIUM BROMIDE 0.03 % NA SOLN: 2.0000 | Freq: Two times a day (BID) | NASAL | Status: DC

## 2013-06-11 MED ORDER — BENZONATATE 100 MG PO CAPS: 100.0000 mg | ORAL_CAPSULE | Freq: Three times a day (TID) | ORAL | Status: DC | PRN

## 2013-06-11 MED ORDER — AMOXICILLIN-POT CLAVULANATE 875-125 MG PO TABS: 1.0000 | ORAL_TABLET | Freq: Two times a day (BID) | ORAL | Status: DC

## 2013-06-11 NOTE — Progress Notes (Signed)
I have examined this patient along with the student and agree.  

## 2013-06-11 NOTE — Progress Notes (Signed)
  Subjective:    Patient ID: Stacey Fuller, female    DOB: 1961-05-04, 52 y.o.   MRN: [id number]  HPI Stacey Fuller is a 52 YO African American female presents with one week of sore throat, dysphagia, odynophagia, ear pain, ear itchiness, and productive cough with green-brownish sputum.  Has used the following with minimal relief:  Gargling peroxide, listerine, salt water; taking Nyquil infusion (with honey); drink tea; using lozenges and cod liver oil.   Denies fever, SOB, chest pain, abdominal pain, nausea, vomiting.  25 smoking pack year history.    Review of Systems As stated in HPI - otherwise negative.    Objective:   Physical Exam Filed Vitals:   06/11/13 1253  BP: 132/92  Pulse: 66  Temp: 97.8 F (36.6 C)  TempSrc: Oral  Resp: 16  Height: 5\' 2"  (1.575 m)  Weight: 213 lb 3.2 oz (96.707 kg)  SpO2: 98%   General:  WDWN female in no acute distress. Skin:  Soft, warm skin with good turgor.  No bruising or lesions present.  HEENT:   Head - normocephalic, no visible or palpable masses.  Eyes - conjunctivae clear, sclera white, PERRL.    Ears - EACs clear.  TMs are translucent and mobile.  Hearing intact.   Nose - Patent.  Nasal mucosa is mildly inflamed.  Septum midline.   Mouth/Throat - Mucous membranes are moist and pink.  No obvious periodontal disease.  Small specks of tonsillar crypt debris. Neck - supple without lymphadenopathy or thyromegaly . Cardiovascular: S1 and S2 distinct with no murmurs, rubs or gallops. Respiratory:  CTA with no adventitious sounds.     Assessment & Plan:  1. Cough Prescribe: - benzonatate (TESSALON) 100 MG capsule; Take 1-2 capsules (100-200 mg total) by mouth 3 (three) times daily as needed for cough.  Dispense: 40 capsule; Refill: 0  2. Sinusitis Prescribe: - amoxicillin-clavulanate (AUGMENTIN) 875-125 MG per tablet; Take 1 tablet by mouth 2 (two) times daily.  Dispense: 20 tablet; Refill: 0 - ipratropium (ATROVENT) 0.03 % nasal spray; Place 2  sprays into the nose 2 (two) times daily.  Dispense: 30 mL; Refill: 0  3. Otalgia, bilateral Due to eustachian tube dysfunction, patient is experiencing ear pain.  Atrovent will help.  Stay hydrated and get some rest.  Call office if symptoms do not steadily improve or any acute worsening.

## 2013-06-11 NOTE — Patient Instructions (Signed)
Quit smoking. Get plenty of rest and drink at least 64 ounces of water daily.

## 2013-06-21 ENCOUNTER — Other Ambulatory Visit (HOSPITAL_COMMUNITY): Payer: Self-pay | Admitting: Obstetrics & Gynecology

## 2013-06-21 DIAGNOSIS — Z1231 Encounter for screening mammogram for malignant neoplasm of breast: Secondary | ICD-10-CM

## 2013-06-28 ENCOUNTER — Ambulatory Visit (HOSPITAL_COMMUNITY)
Admission: RE | Admit: 2013-06-28 | Discharge: 2013-06-28 | Disposition: A | Discharge status: Home / Self Care | Payer: BC Managed Care – PPO | Source: Ambulatory Visit | Attending: Obstetrics & Gynecology | Admitting: Obstetrics & Gynecology

## 2013-06-28 DIAGNOSIS — Z1231 Encounter for screening mammogram for malignant neoplasm of breast: Secondary | ICD-10-CM

## 2014-03-11 ENCOUNTER — Ambulatory Visit (INDEPENDENT_AMBULATORY_CARE_PROVIDER_SITE_OTHER): Payer: BC Managed Care – PPO | Admitting: Family Medicine

## 2014-03-11 VITALS — BP 122/90 | HR 84 | Temp 98.8°F | Resp 18 | Ht 62.0 in | Wt 211.6 lb

## 2014-03-11 DIAGNOSIS — K047 Periapical abscess without sinus: Secondary | ICD-10-CM

## 2014-03-11 DIAGNOSIS — L259 Unspecified contact dermatitis, unspecified cause: Secondary | ICD-10-CM

## 2014-03-11 DIAGNOSIS — L309 Dermatitis, unspecified: Secondary | ICD-10-CM

## 2014-03-11 MED ORDER — HYDROCODONE-ACETAMINOPHEN 5-325 MG PO TABS: 1.0000 | ORAL_TABLET | Freq: Four times a day (QID) | ORAL | Status: DC | PRN

## 2014-03-11 MED ORDER — AMOXICILLIN 875 MG PO TABS: 875.0000 mg | ORAL_TABLET | Freq: Two times a day (BID) | ORAL | Status: DC

## 2014-03-11 NOTE — Patient Instructions (Signed)
Nuala AlphaJanna Civils, DDS    Dental Abscess A dental abscess is a collection of infected fluid (pus) from a bacterial infection in the inner part of the tooth (pulp). It usually occurs at the end of the tooth's root.  CAUSES   Severe tooth decay.  Trauma to the tooth that allows bacteria to enter into the pulp, such as a broken or chipped tooth. SYMPTOMS   Severe pain in and around the infected tooth.  Swelling and redness around the abscessed tooth or in the mouth or face.  Tenderness.  Pus drainage.  Bad breath.  Bitter taste in the mouth.  Difficulty swallowing.  Difficulty opening the mouth.  Nausea.  Vomiting.  Chills.  Swollen neck glands. DIAGNOSIS   A medical and dental history will be taken.  An examination will be performed by tapping on the abscessed tooth.  X-rays may be taken of the tooth to identify the abscess. TREATMENT The goal of treatment is to eliminate the infection. You may be prescribed antibiotic medicine to stop the infection from spreading. A root canal may be performed to save the tooth. If the tooth cannot be saved, it may be pulled (extracted) and the abscess may be drained.  HOME CARE INSTRUCTIONS  Only take over-the-counter or prescription medicines for pain, fever, or discomfort as directed by your caregiver.  Rinse your mouth (gargle) often with salt water ( tsp salt in 8 oz [250 ml] of warm water) to relieve pain or swelling.  Do not drive after taking pain medicine (narcotics).  Do not apply heat to the outside of your face.  Return to your dentist for further treatment as directed. SEEK MEDICAL CARE IF:  Your pain is not helped by medicine.  Your pain is getting worse instead of better. SEEK IMMEDIATE MEDICAL CARE IF:  You have a fever or persistent symptoms for more than 2 3 days.  You have a fever and your symptoms suddenly get worse.  You have chills or a very bad headache.  You have problems breathing or  swallowing.  You have trouble opening your mouth.  You have swelling in the neck or around the eye. Document Released: 11/25/2005 Document Revised: 08/19/2012 Document Reviewed: 03/05/2011 [facility] Patient Information 2014 [place]

## 2014-03-11 NOTE — Progress Notes (Signed)
53 yo woman who works for [employer].  She complains of severe dental pain #29 x 1 week and she has associated facial pain and swelling.  She has been rinsing with peroxide and listerine  Objective:  1/2 of tooth  #29 is missing with gingival hypertrophy.  Assessment:  Dental abscess.  Dental abscess - Plan: HYDROcodone-acetaminophen (NORCO) 5-325 MG per tablet, amoxicillin (AMOXIL) 875 MG tablet  Eczema - Plan: Ambulatory referral to Dermatology  Signed, Elvina SidleKurt Dacoda Spallone, MD

## 2014-06-01 ENCOUNTER — Encounter: Payer: Self-pay | Admitting: Family Medicine

## 2014-08-05 ENCOUNTER — Other Ambulatory Visit (HOSPITAL_COMMUNITY): Payer: Self-pay | Admitting: Obstetrics & Gynecology

## 2014-08-05 DIAGNOSIS — Z1231 Encounter for screening mammogram for malignant neoplasm of breast: Secondary | ICD-10-CM

## 2014-08-22 ENCOUNTER — Ambulatory Visit (HOSPITAL_COMMUNITY)
Admission: RE | Admit: 2014-08-22 | Discharge: 2014-08-22 | Disposition: A | Discharge status: Home / Self Care | Payer: BC Managed Care – PPO | Source: Ambulatory Visit | Attending: Obstetrics & Gynecology | Admitting: Obstetrics & Gynecology

## 2014-08-22 DIAGNOSIS — Z1231 Encounter for screening mammogram for malignant neoplasm of breast: Secondary | ICD-10-CM

## 2014-09-09 ENCOUNTER — Emergency Department (HOSPITAL_COMMUNITY): Payer: BC Managed Care – PPO

## 2014-09-09 ENCOUNTER — Encounter (HOSPITAL_COMMUNITY): Payer: Self-pay | Admitting: Emergency Medicine

## 2014-09-09 ENCOUNTER — Emergency Department (HOSPITAL_COMMUNITY)
Admission: EM | Admit: 2014-09-09 | Discharge: 2014-09-09 | Disposition: A | Discharge status: Home / Self Care | Payer: BC Managed Care – PPO | Attending: Emergency Medicine | Admitting: Emergency Medicine

## 2014-09-09 DIAGNOSIS — Z720 Tobacco use: Secondary | ICD-10-CM | POA: Insufficient documentation

## 2014-09-09 DIAGNOSIS — K5900 Constipation, unspecified: Secondary | ICD-10-CM

## 2014-09-09 DIAGNOSIS — Z90710 Acquired absence of both cervix and uterus: Secondary | ICD-10-CM | POA: Insufficient documentation

## 2014-09-09 DIAGNOSIS — Z79899 Other long term (current) drug therapy: Secondary | ICD-10-CM | POA: Insufficient documentation

## 2014-09-09 DIAGNOSIS — R140 Abdominal distension (gaseous): Secondary | ICD-10-CM

## 2014-09-09 DIAGNOSIS — Z9889 Other specified postprocedural states: Secondary | ICD-10-CM | POA: Insufficient documentation

## 2014-09-09 LAB — CBC WITH DIFFERENTIAL/PLATELET
BASOS ABS: 0 10*3/uL (ref 0.0–0.1)
Basophils Relative: 0 % (ref 0–1)
Eosinophils Absolute: 0.1 10*3/uL (ref 0.0–0.7)
Eosinophils Relative: 2 % (ref 0–5)
HEMATOCRIT: 39.7 % (ref 36.0–46.0)
Hemoglobin: 13.3 g/dL (ref 12.0–15.0)
LYMPHS ABS: 1.5 10*3/uL (ref 0.7–4.0)
LYMPHS PCT: 24 % (ref 12–46)
MCH: 30 pg (ref 26.0–34.0)
MCHC: 33.5 g/dL (ref 30.0–36.0)
MCV: 89.6 fL (ref 78.0–100.0)
MONO ABS: 0.5 10*3/uL (ref 0.1–1.0)
Monocytes Relative: 7 % (ref 3–12)
NEUTROS ABS: 4.2 10*3/uL (ref 1.7–7.7)
Neutrophils Relative %: 67 % (ref 43–77)
Platelets: 236 10*3/uL (ref 150–400)
RBC: 4.43 MIL/uL (ref 3.87–5.11)
RDW: 13.4 % (ref 11.5–15.5)
WBC: 6.2 10*3/uL (ref 4.0–10.5)

## 2014-09-09 LAB — COMPREHENSIVE METABOLIC PANEL
ALT: 18 U/L (ref 0–35)
AST: 15 U/L (ref 0–37)
Albumin: 3.8 g/dL (ref 3.5–5.2)
Alkaline Phosphatase: 104 U/L (ref 39–117)
Anion gap: 11 (ref 5–15)
BILIRUBIN TOTAL: 0.4 mg/dL (ref 0.3–1.2)
BUN: 12 mg/dL (ref 6–23)
CHLORIDE: 99 meq/L (ref 96–112)
CO2: 27 meq/L (ref 19–32)
Calcium: 9.4 mg/dL (ref 8.4–10.5)
Creatinine, Ser: 0.64 mg/dL (ref 0.50–1.10)
GLUCOSE: 106 mg/dL — AB (ref 70–99)
Potassium: 4 mEq/L (ref 3.7–5.3)
Sodium: 137 mEq/L (ref 137–147)
Total Protein: 7.2 g/dL (ref 6.0–8.3)

## 2014-09-09 LAB — URINALYSIS, ROUTINE W REFLEX MICROSCOPIC
BILIRUBIN URINE: NEGATIVE
GLUCOSE, UA: NEGATIVE mg/dL
KETONES UR: NEGATIVE mg/dL
Leukocytes, UA: NEGATIVE
Nitrite: NEGATIVE
Protein, ur: NEGATIVE mg/dL
Specific Gravity, Urine: 1.02 (ref 1.005–1.030)
Urobilinogen, UA: 0.2 mg/dL (ref 0.0–1.0)
pH: 6 (ref 5.0–8.0)

## 2014-09-09 LAB — LIPASE, BLOOD: Lipase: 16 U/L (ref 11–59)

## 2014-09-09 LAB — URINE MICROSCOPIC-ADD ON

## 2014-09-09 NOTE — ED Notes (Signed)
Patient transported to X-ray 

## 2014-09-09 NOTE — ED Provider Notes (Signed)
CSN: [id number]     Arrival date & time 09/09/14  0701 History   First MD Initiated Contact with Patient 09/09/14 0725     Chief Complaint  Patient presents with  . Constipation  . GI Problem     (Consider location/radiation/quality/duration/timing/severity/associated sxs/prior Treatment) HPI 53 year old female presents with one month of abnormal bowel movement frequency and increased belching. She feels bloating as well. Originally start with some back pain and is now moved to her abdomen. She mostly feels pain in her left midabdomen. This pain is constant. Denies any fevers, chills, nausea or vomiting. She's had more irregularity in her bowel movements during this time and has been taking extra medicine such as her Nexium as well as cranberry juice and okra. She normally goes every morning but has now been having more irregular bowel movements. She had a normal bowel movement last night. Denies any urinary symptoms. No current back pain. No distention. Currently rates the pain is moderate. Declines pain medicine at this time. No chest pain or shortness of breath. She has acid reflux but states this does not feel like her reflux.  History reviewed. No pertinent past medical history. Past Surgical History  Procedure Laterality Date  . Abdominal hysterectomy    . Cesarean section     Family History  Problem Relation Age of Onset  . Adopted: Yes  . Cancer Sister     late 72s, breast cancer  . Cancer Sister     early 55s, breast cancer   History  Substance Use Topics  . Smoking status: Current Every Day Smoker -- 1.00 packs/day for 25 years  . Smokeless tobacco: Not on file     Comment: 25 PLUS years  . Alcohol Use: 0.5 - 1.0 oz/week    1-2 drink(s) per week   OB History   Grav Para Term Preterm Abortions TAB SAB Ect Mult Living                 Review of Systems  Constitutional: Negative for fever.  Respiratory: Negative for shortness of breath.   Cardiovascular: Negative for  chest pain.  Gastrointestinal: Positive for abdominal pain and constipation. Negative for nausea and vomiting.  Genitourinary: Negative for dysuria.  Musculoskeletal: Negative for back pain.  All other systems reviewed and are negative.     Allergies  Review of patient's allergies indicates no known allergies.  Home Medications   Prior to Admission medications   Medication Sig Start Date End Date Taking? Authorizing Provider  esomeprazole (NEXIUM) 20 MG capsule Take 40 mg by mouth daily as needed (for acid reflex).   Yes Historical Provider, MD   BP 127/95  Pulse 74  Temp(Src) 98.4 F (36.9 C) (Oral)  Resp 18  SpO2 99% Physical Exam  Nursing note and vitals reviewed. Constitutional: She is oriented to person, place, and time. She appears well-developed and well-nourished. No distress.  HENT:  Head: Normocephalic and atraumatic.  Right Ear: External ear normal.  Left Ear: External ear normal.  Nose: Nose normal.  Eyes: Right eye exhibits no discharge. Left eye exhibits no discharge.  Cardiovascular: Normal rate, regular rhythm and normal heart sounds.   Pulmonary/Chest: Effort normal and breath sounds normal.  Abdominal: Soft. She exhibits no distension and no mass. There is tenderness. There is no rigidity and no guarding.    Neurological: She is alert and oriented to person, place, and time.  Skin: Skin is warm and dry.    ED Course  Procedures (including  critical care time) Labs Review Labs Reviewed  COMPREHENSIVE METABOLIC PANEL - Abnormal; Notable for the following:    Glucose, Bld 106 (*)    All other components within normal limits  URINALYSIS, ROUTINE W REFLEX MICROSCOPIC - Abnormal; Notable for the following:    Hgb urine dipstick TRACE (*)    All other components within normal limits  URINE MICROSCOPIC-ADD ON - Abnormal; Notable for the following:    Bacteria, UA MANY (*)    All other components within normal limits  CBC WITH DIFFERENTIAL  LIPASE,  BLOOD    Imaging Review Dg Abd Acute W/chest  09/09/2014   CLINICAL DATA:  Rib pain, smoker  EXAM: ACUTE ABDOMEN SERIES (ABDOMEN 2 VIEW & CHEST 1 VIEW)  COMPARISON:  Radiograph 01/17/2013  FINDINGS: Normal cardiac silhouette. Lungs are clear. No free air beneath hemidiaphragms.  No dilated loops of large or small bowel. No pathologic calcifications. No organomegaly. Small amount of gas in the rectum. Degenerate changes of the spine.  IMPRESSION: 1.  No acute cardiopulmonary process. 2. No bowel obstruction or free air.   Electronically Signed   By: Genevive BiStewart  Edmunds M.D.   On: 09/09/2014 08:13     EKG Interpretation None      MDM   Final diagnoses:  Constipation, unspecified constipation type  Abdominal bloating    Patient does have some mild mid-left sided abd tenderness but no mass. No fevers and has had this pain for several weeks, low suspicion for diverticulitis or acute surgical emergency. Has trace blood in urine that has been there in past, can f/u with PCP for this. Discussed diet changes to help with her constipation and bloating, and will recommend PCP f/u to ensure good progress. Discussed strict return precautions, such as fevers, worsening abd pain, N/V or other concerning symptoms.    Audree CamelScott T Piper Albro, MD 09/09/14 [phone]

## 2014-09-09 NOTE — ED Notes (Signed)
Pt states decrease in bm's.  Pt has had gas problems where she belches more and louder.  Feels air is getting trapped and more distended.  Pt wants "whole cut in her belly" to let the air out.  Denies any chest pain.  Pt belching on assessment.

## 2014-09-09 NOTE — Discharge Instructions (Signed)
Bloating Bloating is the feeling of fullness in your belly. You may feel as though your pants are too tight. Often the cause of bloating is overeating, retaining fluids, or having gas in your bowel. It is also caused by swallowing air and eating foods that cause gas. Irritable bowel syndrome is one of the most common causes of bloating. Constipation is also a common cause. Sometimes more serious problems can cause bloating. SYMPTOMS  Usually there is a feeling of fullness, as though your abdomen is bulged out. There may be mild discomfort.  DIAGNOSIS  Usually no particular testing is necessary for most bloating. If the condition persists and seems to become worse, your caregiver may do additional testing.  TREATMENT   There is no direct treatment for bloating.  Do not put gas into the bowel. Avoid chewing gum and sucking on candy. These tend to make you swallow air. Swallowing air can also be a nervous habit. Try to avoid this.  Avoiding high residue diets will help. Eat foods with soluble fibers (examples include root vegetables, apples, or barley) and substitute dairy products with soy and rice products. This helps irritable bowel syndrome.  If constipation is the cause, then a high residue diet with more fiber will help.  Avoid carbonated beverages.  Over-the-counter preparations are available that help reduce gas. Your pharmacist can help you with this. SEEK MEDICAL CARE IF:   Bloating continues and seems to be getting worse.  You notice a weight gain.  You have a weight loss but the bloating is getting worse.  You have changes in your bowel habits or develop nausea or vomiting. SEEK IMMEDIATE MEDICAL CARE IF:   You develop shortness of breath or swelling in your legs.  You have an increase in abdominal pain or develop chest pain. Document Released: 09/25/2006 Document Revised: 02/17/2012 Document Reviewed: 11/13/2007 [facility] Patient Information 2015 [place], [place]. This  information is not intended to replace advice given to you by your health care provider. Make sure you discuss any questions you have with your health care provider.    Constipation Constipation is when a person has fewer than three bowel movements a week, has difficulty having a bowel movement, or has stools that are dry, hard, or larger than normal. As people grow older, constipation is more common. If you try to fix constipation with medicines that make you have a bowel movement (laxatives), the problem may get worse. Long-term laxative use may cause the muscles of the colon to become weak. A low-fiber diet, not taking in enough fluids, and taking certain medicines may make constipation worse.  CAUSES   Certain medicines, such as antidepressants, pain medicine, iron supplements, antacids, and water pills.   Certain diseases, such as diabetes, irritable bowel syndrome (IBS), thyroid disease, or depression.   Not drinking enough water.   Not eating enough fiber-rich foods.   Stress or travel.   Lack of physical activity or exercise.   Ignoring the urge to have a bowel movement.   Using laxatives too much.  SIGNS AND SYMPTOMS   Having fewer than three bowel movements a week.   Straining to have a bowel movement.   Having stools that are hard, dry, or larger than normal.   Feeling full or bloated.   Pain in the lower abdomen.   Not feeling relief after having a bowel movement.  DIAGNOSIS  Your health care provider will take a medical history and perform a physical exam. Further testing may be done for  severe constipation. Some tests may include:  A barium enema X-ray to examine your rectum, colon, and, sometimes, your small intestine.   A sigmoidoscopy to examine your lower colon.   A colonoscopy to examine your entire colon. TREATMENT  Treatment will depend on the severity of your constipation and what is causing it. Some dietary treatments include  drinking more fluids and eating more fiber-rich foods. Lifestyle treatments may include regular exercise. If these diet and lifestyle recommendations do not help, your health care provider may recommend taking over-the-counter laxative medicines to help you have bowel movements. Prescription medicines may be prescribed if over-the-counter medicines do not work.  HOME CARE INSTRUCTIONS   Eat foods that have a lot of fiber, such as fruits, vegetables, whole grains, and beans.  Limit foods high in fat and processed sugars, such as french fries, hamburgers, cookies, candies, and soda.   A fiber supplement may be added to your diet if you cannot get enough fiber from foods.   Drink enough fluids to keep your urine clear or pale yellow.   Exercise regularly or as directed by your health care provider.   Go to the restroom when you have the urge to go. Do not hold it.   Only take over-the-counter or prescription medicines as directed by your health care provider. Do not take other medicines for constipation without talking to your health care provider first.  SEEK IMMEDIATE MEDICAL CARE IF:   You have bright red blood in your stool.   Your constipation lasts for more than 4 days or gets worse.   You have abdominal or rectal pain.   You have thin, pencil-like stools.   You have unexplained weight loss. MAKE SURE YOU:   Understand these instructions.  Will watch your condition.  Will get help right away if you are not doing well or get worse. Document Released: 08/23/2004 Document Revised: 11/30/2013 Document Reviewed: 09/06/2013 [facility] Patient Information 2015 [place]. This information is not intended to replace advice given to you by your health care provider. Make sure you discuss any questions you have with your health care provider.    High-Fiber Diet Fiber is found in fruits, vegetables, and grains. A high-fiber diet encourages the addition of more whole  grains, legumes, fruits, and vegetables in your diet. The recommended amount of fiber for adult males is 38 g per day. For adult females, it is 25 g per day. Pregnant and lactating women should get 28 g of fiber per day. If you have a digestive or bowel problem, ask your caregiver for advice before adding high-fiber foods to your diet. Eat a variety of high-fiber foods instead of only a select few type of foods.  PURPOSE  To increase stool bulk.  To make bowel movements more regular to prevent constipation.  To lower cholesterol.  To prevent overeating. WHEN IS THIS DIET USED?  It may be used if you have constipation and hemorrhoids.  It may be used if you have uncomplicated diverticulosis (intestine condition) and irritable bowel syndrome.  It may be used if you need help with weight management.  It may be used if you want to add it to your diet as a protective measure against atherosclerosis, diabetes, and cancer. SOURCES OF FIBER  Whole-grain breads and cereals.  Fruits, such as apples, oranges, bananas, berries, prunes, and pears.  Vegetables, such as green peas, carrots, sweet potatoes, beets, broccoli, cabbage, spinach, and artichokes.  Legumes, such split peas, soy, lentils.  Almonds. FIBER  CONTENT IN FOODS Starches and Grains / Dietary Fiber (g)  Cheerios, 1 cup / 3 g  Corn Flakes cereal, 1 cup / 0.7 g  Rice crispy treat cereal, 1 cup / 0.3 g  Instant oatmeal (cooked),  cup / 2 g  Frosted wheat cereal, 1 cup / 5.1 g  Brown, long-grain rice (cooked), 1 cup / 3.5 g  White, long-grain rice (cooked), 1 cup / 0.6 g  Enriched macaroni (cooked), 1 cup / 2.5 g Legumes / Dietary Fiber (g)  Baked beans (canned, plain, or vegetarian),  cup / 5.2 g  Kidney beans (canned),  cup / 6.8 g  Pinto beans (cooked),  cup / 5.5 g Breads and Crackers / Dietary Fiber (g)  Plain or honey graham crackers, 2 squares / 0.7 g  Saltine crackers, 3 squares / 0.3 g  Plain,  salted pretzels, 10 pieces / 1.8 g  Whole-wheat bread, 1 slice / 1.9 g  White bread, 1 slice / 0.7 g  Raisin bread, 1 slice / 1.2 g  Plain bagel, 3 oz / 2 g  Flour tortilla, 1 oz / 0.9 g  Corn tortilla, 1 small / 1.5 g  Hamburger or hotdog bun, 1 small / 0.9 g Fruits / Dietary Fiber (g)  Apple with skin, 1 medium / 4.4 g  Sweetened applesauce,  cup / 1.5 g  Banana,  medium / 1.5 g  Grapes, 10 grapes / 0.4 g  Orange, 1 small / 2.3 g  Raisin, 1.5 oz / 1.6 g  Melon, 1 cup / 1.4 g Vegetables / Dietary Fiber (g)  Green beans (canned),  cup / 1.3 g  Carrots (cooked),  cup / 2.3 g  Broccoli (cooked),  cup / 2.8 g  Peas (cooked),  cup / 4.4 g  Mashed potatoes,  cup / 1.6 g  Lettuce, 1 cup / 0.5 g  Corn (canned),  cup / 1.6 g  Tomato,  cup / 1.1 g Document Released: 11/25/2005 Document Revised: 05/26/2012 Document Reviewed: 02/27/2012 [employer] Patient Information 2015 [place], [place]. This information is not intended to replace advice given to you by your health care provider. Make sure you discuss any questions you have with your health care provider.

## 2015-08-02 ENCOUNTER — Other Ambulatory Visit (HOSPITAL_COMMUNITY): Payer: Self-pay | Admitting: Obstetrics & Gynecology

## 2015-08-28 ENCOUNTER — Ambulatory Visit (HOSPITAL_COMMUNITY)
Admission: RE | Admit: 2015-08-28 | Discharge: 2015-08-28 | Disposition: A | Discharge status: Home / Self Care | Payer: Self-pay | Source: Ambulatory Visit | Attending: Obstetrics & Gynecology | Admitting: Obstetrics & Gynecology

## 2015-08-28 ENCOUNTER — Other Ambulatory Visit (HOSPITAL_COMMUNITY): Payer: Self-pay | Admitting: Obstetrics & Gynecology

## 2015-08-28 DIAGNOSIS — Z1231 Encounter for screening mammogram for malignant neoplasm of breast: Secondary | ICD-10-CM

## 2016-04-19 ENCOUNTER — Ambulatory Visit (INDEPENDENT_AMBULATORY_CARE_PROVIDER_SITE_OTHER): Payer: Self-pay | Admitting: Physician Assistant

## 2016-04-19 VITALS — BP 130/90 | HR 75 | Temp 98.0°F | Resp 16 | Ht 62.5 in | Wt 207.6 lb

## 2016-04-19 DIAGNOSIS — H6981 Other specified disorders of Eustachian tube, right ear: Secondary | ICD-10-CM

## 2016-04-19 DIAGNOSIS — J069 Acute upper respiratory infection, unspecified: Secondary | ICD-10-CM

## 2016-04-19 MED ORDER — IPRATROPIUM BROMIDE 0.03 % NA SOLN: 2.0000 | Freq: Two times a day (BID) | NASAL | Status: DC

## 2016-04-19 NOTE — Patient Instructions (Addendum)
Drink plenty of water (64 oz/day) and get plenty of rest. Use nasal spray twice a day  May take zyrtec at night. If your symptoms are not improving in 3-4 weeks or at any time if symptoms get much worse, call and let me know.     IF you received an x-ray today, you will receive an invoice from [facility]. Please contact [facility] at [phone] with questions or concerns regarding your invoice.   IF you received labwork today, you will receive an invoice from [facility]. Please contact [facility] at [phone] with questions or concerns regarding your invoice.   Our billing staff will not be able to assist you with questions regarding bills from these companies.  You will be contacted with the lab results as soon as they are available. The fastest way to get your results is to activate your My Chart account. Instructions are located on the last page of this paperwork. If you have not heard from us regarding the results in 2 weeks, please contact this office.

## 2016-04-19 NOTE — Progress Notes (Signed)
[facility] [address], [place] [address] [phone]  Date:  04/19/2016   Name:  Nigel BridgemanSherry M Doolittle   DOB:  11/30/1961   MRN:  [id number]  PCP:  No PCP Per Patient    Chief Complaint: Ear Pain   History of Present Illness:  This is a 55 y.o. female with PMH tobacco abuse who is presenting with otalgia and sore throat x 1 week. Right sided sore throat. Right ear and throat hurt with swallowing. She denies env allergies or recent URI. Denies nasal congestion, cough, fever, chills.  Aggravating/alleviating factors: put some peroxide in right ear and helped some. History of asthma: no History of env allergies: no Tobacco use: 1 ppd  Review of Systems:  Review of Systems See HPI  There are no active problems to display for this patient.   Prior to Admission medications   Medication Sig Start Date End Date Taking? Authorizing Provider  esomeprazole (NEXIUM) 20 MG capsule Take 40 mg by mouth daily as needed (for acid reflex).   Yes Historical Provider, MD    No Known Allergies  Past Surgical History  Procedure Laterality Date  . Abdominal hysterectomy    . Cesarean section      Social History  Substance Use Topics  . Smoking status: Current Every Day Smoker -- 1.00 packs/day for 25 years  . Smokeless tobacco: None     Comment: 25 PLUS years  . Alcohol Use: 0.5 - 1.0 oz/week    1-2 Standard drinks or equivalent per week    Family History  Problem Relation Age of Onset  . Adopted: Yes  . Cancer Sister     late 8950s, breast cancer  . Cancer Sister     early 8650s, breast cancer    Medication list has been reviewed and updated.  Physical Examination:  Physical Exam  Constitutional: She is oriented to person, place, and time. She appears well-developed and well-nourished. No distress.  HENT:  Head: Normocephalic and atraumatic.  Right Ear: Hearing, external ear and ear canal normal. Tympanic membrane is retracted.  Left Ear: Hearing, external  ear and ear canal normal.  Nose: Nose normal.  Mouth/Throat: Uvula is midline and mucous membranes are normal. Posterior oropharyngeal erythema (mild) present. No oropharyngeal exudate or posterior oropharyngeal edema.  TM partially obstructed by cerumen, superior TM clear  Eyes: Conjunctivae and lids are normal. Right eye exhibits no discharge. Left eye exhibits no discharge. No scleral icterus.  Cardiovascular: Normal rate, regular rhythm, normal heart sounds and normal pulses.   No murmur heard. Pulmonary/Chest: Effort normal and breath sounds normal. No respiratory distress. She has no wheezes. She has no rhonchi. She has no rales.  Musculoskeletal: Normal range of motion.  Lymphadenopathy:       Head (right side): No submental, no submandibular and no tonsillar adenopathy present.       Head (left side): No submental, no submandibular and no tonsillar adenopathy present.    She has no cervical adenopathy.  Neurological: She is alert and oriented to person, place, and time.  Skin: Skin is warm, dry and intact. No lesion and no rash noted.  Psychiatric: She has a normal mood and affect. Her speech is normal and behavior is normal. Thought content normal.   BP 130/90 mmHg  Pulse 75  Temp(Src) 98 F (36.7 C) (Oral)  Resp 16  Ht 5' 2.5" (1.588 m)  Wt 207 lb 9.6 oz (94.167 kg)  BMI 37.34 kg/m2  SpO2 99%  Assessment and Plan:  1. ETD (eustachian tube dysfunction), right 2. Viral URI Suspect viral URI and ETD as cause of symptoms. Throat is mildly erythematous, consistent with postnasal drip. Right TM is retracted. Treat with atrovent and zyrtec. Counseled if ear pain is not improved in 3-4 weeks or at any time if symptoms worsen, call and let us know. She is self pay -- hoping to save her a second visit if possible. - ipratropium (ATROVENT) 0.03 % nasal spray; Place 2 sprays into both nostrils 2 (two) times daily.  Dispense: 30 mL; Refill: 0    Briseyda Fehr V. Dyke Brackett, MHS [facility]  04/19/2016

## 2016-04-23 ENCOUNTER — Telehealth: Payer: Self-pay

## 2016-04-23 NOTE — Telephone Encounter (Signed)
Patient is requesting cough medication to break up her congestion.   [place]   [phone]

## 2016-04-24 ENCOUNTER — Emergency Department (HOSPITAL_COMMUNITY)
Admission: EM | Admit: 2016-04-24 | Discharge: 2016-04-24 | Disposition: A | Discharge status: Home / Self Care | Payer: Self-pay | Attending: Emergency Medicine | Admitting: Emergency Medicine

## 2016-04-24 ENCOUNTER — Encounter (HOSPITAL_COMMUNITY): Payer: Self-pay | Admitting: Emergency Medicine

## 2016-04-24 DIAGNOSIS — Z791 Long term (current) use of non-steroidal anti-inflammatories (NSAID): Secondary | ICD-10-CM | POA: Insufficient documentation

## 2016-04-24 DIAGNOSIS — Z79899 Other long term (current) drug therapy: Secondary | ICD-10-CM | POA: Insufficient documentation

## 2016-04-24 DIAGNOSIS — J029 Acute pharyngitis, unspecified: Secondary | ICD-10-CM

## 2016-04-24 DIAGNOSIS — F17200 Nicotine dependence, unspecified, uncomplicated: Secondary | ICD-10-CM | POA: Insufficient documentation

## 2016-04-24 DIAGNOSIS — Z7951 Long term (current) use of inhaled steroids: Secondary | ICD-10-CM | POA: Insufficient documentation

## 2016-04-24 DIAGNOSIS — Z792 Long term (current) use of antibiotics: Secondary | ICD-10-CM | POA: Insufficient documentation

## 2016-04-24 DIAGNOSIS — H6691 Otitis media, unspecified, right ear: Secondary | ICD-10-CM

## 2016-04-24 MED ORDER — NAPROXEN 250 MG PO TABS
500.0000 mg | ORAL_TABLET | Freq: Once | ORAL | Status: AC
  Administered 2016-04-24: 500 mg via ORAL
  Filled 2016-04-24: qty 2

## 2016-04-24 MED ORDER — BENZOCAINE-MENTHOL 6-10 MG MT LOZG: 1.0000 | LOZENGE | OROMUCOSAL | Status: DC | PRN

## 2016-04-24 MED ORDER — NAPROXEN 500 MG PO TABS: 500.0000 mg | ORAL_TABLET | Freq: Two times a day (BID) | ORAL | Status: DC

## 2016-04-24 MED ORDER — AMOXICILLIN 500 MG PO CAPS: 500.0000 mg | ORAL_CAPSULE | Freq: Two times a day (BID) | ORAL | Status: DC

## 2016-04-24 NOTE — ED Provider Notes (Signed)
CSN: [id number]     Arrival date & time 04/24/16  0820 History   First MD Initiated Contact with Patient 04/24/16 [phone]     Chief Complaint  Patient presents with  . Otalgia  . Sore Throat    HPI   Stacey Fuller is an 55 y.o. female with no significant PMH who presents to the ED for evaluation of URI symptoms. She states that for the past week she has had right ear pain. Denies muffled hearing or tinnitus. The pain is worse with swallowing. She also notes she has a dry cough with associated sore throat. [facility] and was prescribed atrovent nasal spray for nasal congestion but nothing for her ears or throat. Denies fever, chills, dysphagia, n/v/d, abdominal pain, chest pain, or SOB. She has tried peroxide in her ears with no relief.   History reviewed. No pertinent past medical history. Past Surgical History  Procedure Laterality Date  . Abdominal hysterectomy    . Cesarean section     Family History  Problem Relation Age of Onset  . Adopted: Yes  . Cancer Sister     late 5850s, breast cancer  . Cancer Sister     early 3150s, breast cancer   Social History  Substance Use Topics  . Smoking status: Current Every Day Smoker -- 1.00 packs/day for 25 years  . Smokeless tobacco: None     Comment: 25 PLUS years  . Alcohol Use: 0.5 - 1.0 oz/week    1-2 Standard drinks or equivalent per week   OB History    No data available     Review of Systems  All other systems reviewed and are negative.     Allergies  Review of patient's allergies indicates no known allergies.  Home Medications   Prior to Admission medications   Medication Sig Start Date End Date Taking? Authorizing Provider  esomeprazole (NEXIUM) 20 MG capsule Take 40 mg by mouth daily as needed (for acid reflex).   Yes Historical Provider, MD  ipratropium (ATROVENT) 0.03 % nasal spray Place 2 sprays into both nostrils 2 (two) times daily. 04/19/16  Yes Lanier ClamNicole Bush V, PA-C  triamcinolone ointment (KENALOG)  0.1 % Apply 1 application topically 2 (two) times daily as needed (skin irritation).   Yes Historical Provider, MD  amoxicillin (AMOXIL) 500 MG capsule Take 1 capsule (500 mg total) by mouth 2 (two) times daily. 04/24/16   Ace GinsSerena Y Cael Worth, PA-C  benzocaine-menthol (CHLORAEPTIC) 6-10 MG lozenge Take 1 lozenge by mouth as needed for sore throat. 04/24/16   Ace GinsSerena Y Indica Marcott, PA-C  naproxen (NAPROSYN) 500 MG tablet Take 1 tablet (500 mg total) by mouth 2 (two) times daily. 04/24/16   Ace GinsSerena Y Anders Hohmann, PA-C   BP 126/79 mmHg  Pulse 79  Temp(Src) 97.5 F (36.4 C) (Oral)  Resp 18  SpO2 98% Physical Exam  Constitutional: She is oriented to person, place, and time. No distress.  HENT:  Head: Atraumatic.  Right Ear: External ear normal.  Left Ear: External ear normal.  Nose: Nose normal.  Mouth/Throat: Posterior oropharyngeal erythema present. No oropharyngeal exudate or posterior oropharyngeal edema.  R TM erythematous with effusion. Canal NL.  L TM unable to be visualized due to cerumen impaction. Canal otherwise NL.  No mastoid erythema, edema, or erythema bilaterally. No external ear abnormalities.  Eyes: Conjunctivae are normal. No scleral icterus.  Neck: Normal range of motion. Neck supple.  Cardiovascular: Normal rate, regular rhythm and normal heart sounds.  Pulmonary/Chest: Effort normal and breath sounds normal. No respiratory distress. She has no wheezes. She has no rales.  Abdominal: Soft. She exhibits no distension. There is no tenderness.  Neurological: She is alert and oriented to person, place, and time.  Skin: Skin is warm and dry. She is not diaphoretic.  Psychiatric: She has a normal mood and affect. Her behavior is normal.  Nursing note and vitals reviewed.   ED Course  Procedures (including critical care time) Labs Review Labs Reviewed - No data to display  Imaging Review No results found. I have personally reviewed and evaluated these images and lab results as part of my medical  decision-making.   EKG Interpretation None      MDM   Final diagnoses:  Acute right otitis media, recurrence not specified, unspecified otitis media type  Sore throat    Pt with AOM of right ear on exam. L ear unable to be visualized due to cerumen impaction. No exudate, edema of posterior oropharynx. No adventitious lung sounds. Pt is afebrile and without hypoxia or tachypnea. Will treat AOM with amoxicillin. Supportive meds also prescribed for sore throat and ear pain. Encouraged other supportive therapies. Resource guide given to establish pcp for follow up. ER return precautions given.    Carlene Coria, PA-C 04/24/16 1610  Vanetta Mulders, MD 04/27/16 [phone]

## 2016-04-24 NOTE — Telephone Encounter (Signed)
Patient was seen in ED today. She take Mucinex to loosen secretions and she should be drinking enough water so her urine is light yellow.

## 2016-04-24 NOTE — Discharge Instructions (Signed)
Take medications as prescribed. Return to the emergency room for worsening condition or new concerning symptoms. Follow up with your regular doctor. If you don't have a regular doctor use one of the numbers below to establish a primary care doctor. ° ° °Emergency Department Resource Guide °1) Find a Doctor and Pay Out of Pocket °Although you won't have to find out who is covered by your insurance plan, it is a good idea to ask around and get recommendations. You will then need to call the office and see if the doctor you have chosen will accept you as a new patient and what types of options they offer for patients who are self-pay. Some doctors offer discounts or will set up payment plans for their patients who do not have insurance, but you will need to ask so you aren't surprised when you get to your appointment. ° °2) Contact Your Local Health Department °Not all health departments have doctors that can see patients for sick visits, but many do, so it is worth a call to see if yours does. If you don't know where your local health department is, you can check in your phone book. The CDC also has a tool to help you locate your state's health department, and many state websites also have listings of all of their local health departments. ° °3) Find a Walk-in Clinic °If your illness is not likely to be very severe or complicated, you may want to try a walk in clinic. These are popping up all over the country in pharmacies, drugstores, and shopping centers. They're usually staffed by nurse practitioners or physician assistants that have been trained to treat common illnesses and complaints. They're usually fairly quick and inexpensive. However, if you have serious medical issues or chronic medical problems, these are probably not your best option. ° °No Primary Care Doctor: °- Call [facility] at  832-8000 - they can help you locate a primary care doctor that  accepts your insurance, provides certain services,  etc. °- Physician Referral Service- 1-800-533-3463 ° °Emergency Department Resource Guide °1) Find a Doctor and Pay Out of Pocket °Although you won't have to find out who is covered by your insurance plan, it is a good idea to ask around and get recommendations. You will then need to call the office and see if the doctor you have chosen will accept you as a new patient and what types of options they offer for patients who are self-pay. Some doctors offer discounts or will set up payment plans for their patients who do not have insurance, but you will need to ask so you aren't surprised when you get to your appointment. ° °2) Contact Your Local Health Department °Not all health departments have doctors that can see patients for sick visits, but many do, so it is worth a call to see if yours does. If you don't know where your local health department is, you can check in your phone book. The CDC also has a tool to help you locate your state's health department, and many state websites also have listings of all of their local health departments. ° °3) Find a Walk-in Clinic °If your illness is not likely to be very severe or complicated, you may want to try a walk in clinic. These are popping up all over the country in pharmacies, drugstores, and shopping centers. They're usually staffed by nurse practitioners or physician assistants that have been trained to treat common illnesses and complaints. They're usually fairly   quick and inexpensive. However, if you have serious medical issues or chronic medical problems, these are probably not your best option. ° °No Primary Care Doctor: °- Call [facility] at  832-8000 - they can help you locate a primary care doctor that  accepts your insurance, provides certain services, etc. °- Physician Referral Service- 1-800-533-3463 ° °Chronic Pain Problems: °Organization         Address  Phone   Notes  ° [facility]  ([phone]) [phone] Patients need to be referred by  their primary care doctor.  ° °Medication Assistance: °Organization         Address  Phone   Notes  °[place] [facility] [address] °[place], [place] [address] [phone]) [phone] --Must be a resident of [place] °-- Must have NO insurance coverage whatsoever (no Medicaid/ Medicare, etc.) °-- The pt. MUST have a primary care doctor that directs their care regularly and follows them in the community °  °[facility]  (866) 331-1348   °[employer]  (888) 892-1162   ° °Agencies that provide inexpensive medical care: °Organization         Address  Phone   Notes  °[facility]  ([phone]) [phone]   °[facility]    (336) 832-7272   °[facility] [address] °[place], [place] [address] ([phone]) [phone]   °[facility] [address], °[place] (336) [phone]   °[facility]    (336) 373-0678   °[facility]    (336) 272-1050   °[facility] ° [address], [place] Phone:  (336) [phone], Fax:  ([phone]) [phone] Hours of Operation:  9 am - 6 pm, M-F.  Also accepts Medicaid/Medicare and self-pay.  °[facility] ° [address], [place] Phone: (336) [phone], Fax: ([phone]) [phone]. Hours of Operation:  8:30 am - 5:30 pm, M-F.  Also accepts Medicaid and self-pay.  °[facility] [place] [address], [place] Phone: (336) [phone]   °[facility] [address], [place], [place] (336)723-1848, Ext. 123 Mondays & Thursdays: 7-9 AM.  First 15 patients are seen on a first come, first serve basis. °  ° °Medicaid-accepting [place] Providers: ° °Organization         Address  Phone   Notes  °[facility] [address], [place] ([phone]) [phone] Also accepts self-pay patients.  °[facility] [address], [place] ° (336) [phone]   °[facility]  [address], [place] (336) [phone]   °[facility] [address] [place] [address], [place] (336) [phone]   °[facility] [address], [place]  ° ([phone]) [phone] Only accepts [place] Access Medicaid patients after they have their name applied to their card.  ° °Self-Pay (no insurance) in [place]: ° °Organization         Address  Phone   Notes  °Sickle Cell Patients, [facility] [address], [place] (336) [phone]   °[facility] [address], [place] ([phone]) [phone]   °[facility] ° [address], [place] (336) [phone]   °[facility]/Dr. Osei-Bonsu ° [address] [place] [address],  or [address], [place] [phone]) [phone] Phone number   for both [place] and [place] locations is the same.  °[facility] [address], [place] ([phone]) [phone]   °[facility] [address] [place] [address], [place] or [address] ([phone]) [phone] °(336) [phone]   °[facility] [address], [place] (336) [phone], phone; ([phone]) [phone], fax Sees patients 1st and 3rd Saturday of every month.  Must not qualify for public or private insurance (i.e. Medicaid, Medicare, [place] Health Choice, Veterans' Benefits) • Household income should be no more than 200% of the poverty level •The clinic cannot treat you if you are pregnant or think you are pregnant • Sexually transmitted diseases are not treated at the clinic.  ° ° ° °

## 2016-04-24 NOTE — ED Notes (Signed)
Pt sts sore throat and cough with right earache x several days; pt sts non productive cough

## 2016-04-25 NOTE — Telephone Encounter (Signed)
Spoke with pt, advised message. Pt was seen in the ED.

## 2017-03-25 DIAGNOSIS — Z1231 Encounter for screening mammogram for malignant neoplasm of breast: Secondary | ICD-10-CM | POA: Diagnosis not present

## 2017-03-25 DIAGNOSIS — Z6838 Body mass index (BMI) 38.0-38.9, adult: Secondary | ICD-10-CM | POA: Diagnosis not present

## 2017-03-25 DIAGNOSIS — Z01419 Encounter for gynecological examination (general) (routine) without abnormal findings: Secondary | ICD-10-CM | POA: Diagnosis not present

## 2017-06-05 ENCOUNTER — Encounter (HOSPITAL_COMMUNITY): Payer: Self-pay | Admitting: *Deleted

## 2017-06-05 ENCOUNTER — Emergency Department (HOSPITAL_COMMUNITY)
Admission: EM | Admit: 2017-06-05 | Discharge: 2017-06-05 | Disposition: A | Discharge status: Home / Self Care | Payer: Self-pay | Attending: Emergency Medicine | Admitting: Emergency Medicine

## 2017-06-05 DIAGNOSIS — Z5321 Procedure and treatment not carried out due to patient leaving prior to being seen by health care provider: Secondary | ICD-10-CM | POA: Insufficient documentation

## 2017-06-05 DIAGNOSIS — R1031 Right lower quadrant pain: Secondary | ICD-10-CM | POA: Insufficient documentation

## 2017-06-05 NOTE — ED Triage Notes (Signed)
Patient is alert and oriented x4.  She is complaining of RLQ pain that does not radiate  that started yesterday.  Patient states that she works at [employer] as a [employer] and does a lot of pulling.  Currently she rates her pain 10 of 10.

## 2017-06-05 NOTE — ED Notes (Signed)
Patient called a second time with no response.

## 2017-06-05 NOTE — ED Notes (Signed)
Patient called for vital sign recheck but had no response.

## 2017-06-05 NOTE — ED Notes (Signed)
Pt not found in lobby 

## 2018-04-02 DIAGNOSIS — Z01419 Encounter for gynecological examination (general) (routine) without abnormal findings: Secondary | ICD-10-CM | POA: Diagnosis not present

## 2018-04-02 DIAGNOSIS — Z6839 Body mass index (BMI) 39.0-39.9, adult: Secondary | ICD-10-CM | POA: Diagnosis not present

## 2018-04-02 DIAGNOSIS — Z1231 Encounter for screening mammogram for malignant neoplasm of breast: Secondary | ICD-10-CM | POA: Diagnosis not present

## 2018-04-02 DIAGNOSIS — Z13220 Encounter for screening for lipoid disorders: Secondary | ICD-10-CM | POA: Diagnosis not present

## 2018-04-02 DIAGNOSIS — Z131 Encounter for screening for diabetes mellitus: Secondary | ICD-10-CM | POA: Diagnosis not present

## 2018-04-16 ENCOUNTER — Encounter: Payer: Self-pay | Admitting: Family Medicine

## 2018-04-16 ENCOUNTER — Ambulatory Visit (INDEPENDENT_AMBULATORY_CARE_PROVIDER_SITE_OTHER): Payer: BLUE CROSS/BLUE SHIELD | Admitting: Family Medicine

## 2018-04-16 ENCOUNTER — Other Ambulatory Visit: Payer: Self-pay

## 2018-04-16 VITALS — BP 124/86 | HR 100 | Temp 98.2°F | Ht 61.0 in | Wt 214.0 lb

## 2018-04-16 DIAGNOSIS — H1013 Acute atopic conjunctivitis, bilateral: Secondary | ICD-10-CM | POA: Diagnosis not present

## 2018-04-16 MED ORDER — OLOPATADINE HCL 0.2 % OP SOLN: 1.0000 [drp] | Freq: Every day | OPHTHALMIC | 1 refills | Status: DC

## 2018-04-16 NOTE — Patient Instructions (Addendum)
     IF you received an x-ray today, you will receive an invoice from [facility]. Please contact [facility] at [phone] with questions or concerns regarding your invoice.   IF you received labwork today, you will receive an invoice from [facility]. Please contact [facility] at [phone] with questions or concerns regarding your invoice.   Our billing staff will not be able to assist you with questions regarding bills from these companies.  You will be contacted with the lab results as soon as they are available. The fastest way to get your results is to activate your My Chart account. Instructions are located on the last page of this paperwork. If you have not heard from us regarding the results in 2 weeks, please contact this office.     Allergic Conjunctivitis A clear membrane (conjunctiva) covers the white part of your eye and the inner surface of your eyelid. Allergic conjunctivitis happens when this membrane has inflammation. This is caused by allergies. Common causes of allergic reactions (allergens)include:  Outdoor allergens, such as: ? Pollen. ? Grass and weeds. ? Mold spores.  Indoor allergens, such as: ? Dust. ? Smoke. ? Mold. ? Pet dander. ? Animal hair.  This condition can make your eye red or pink. It can also make your eye feel itchy. This condition cannot be spread from one person to another person (is not contagious). Follow these instructions at home:  Try not to be around things that you are allergic to.  Take or apply over-the-counter and prescription medicines only as told by your doctor. These include any eye drops.  Place a cool, clean washcloth on your eye for 10-20 minutes. Do this 3-4 times a day.  Do not touch or rub your eyes.  Do not wear contact lenses until the inflammation is gone. Wear glasses instead.  Do not wear eye makeup until the inflammation is gone.  Keep all follow-up visits as told by your doctor. This is  important. Contact a doctor if:  Your symptoms get worse.  Your symptoms do not get better with treatment.  You have mild eye pain.  You are sensitive to light,  You have spots or blisters on your eyes.  You have pus coming from your eye.  You have a fever. Get help right away if:  You have redness, swelling, or other symptoms in only one eye.  Your vision is blurry.  You have vision changes.  You have very bad eye pain. Summary  Allergic conjunctivitis is caused by allergies. It can make your eye red or pink, and it can make your eye feel itchy.  This condition cannot be spread from one person to another person (is not contagious).  Try not to be around things that you are allergic to.  Take or apply over-the-counter and prescription medicines only as told by your doctor. These include any eye drops.  Contact your doctor if your symptoms get worse or they do not get better with treatment. This information is not intended to replace advice given to you by your health care provider. Make sure you discuss any questions you have with your health care provider. Document Released: 05/15/2010 Document Revised: 07/19/2016 Document Reviewed: 07/19/2016 Elsevier Interactive Patient Education  2017 Elsevier Inc.  

## 2018-04-16 NOTE — Progress Notes (Signed)
5/9/20192:35 PM  Stacey Fuller [birth year], 57 y.o. female [id number]  Chief Complaint  Patient presents with  . Eye Problem    after eating at [employer] rest[employer], eyes began to itch. Irritation is mostly coming from right eye    HPI:   Patient is a 57 y.o. female who presents today for right eye irritation. She states that she was fine eating crab legs and scratched her eye. Afterwards it has become red and continues to itch, slightly watery. She denies any vision changes or pain. She denies any FB sensation. She denies any swelling of lips or SOB. She has not tried anything for this.  Fall Risk  04/16/2018 04/19/2016  Falls in the past year? No No     Depression screen [facility] 2/9 04/16/2018 04/19/2016  Decreased Interest 0 0  Down, Depressed, Hopeless 0 0  PHQ - 2 Score 0 0    No Known Allergies  Prior to Admission medications   Medication Sig Start Date End Date Taking? Authorizing Provider  amoxicillin (AMOXIL) 500 MG capsule Take 1 capsule (500 mg total) by mouth 2 (two) times daily. 04/24/16   Sam, Ace Gins, PA-C  benzocaine-menthol (CHLORAEPTIC) 6-10 MG lozenge Take 1 lozenge by mouth as needed for sore throat. 04/24/16   Sam, Ace Gins, PA-C  esomeprazole (NEXIUM) 20 MG capsule Take 40 mg by mouth daily as needed (for acid reflex).    [provider]  ipratropium (ATROVENT) 0.03 % nasal spray Place 2 sprays into both nostrils 2 (two) times daily. 04/19/16   Dorna Leitz, PA-C  naproxen (NAPROSYN) 500 MG tablet Take 1 tablet (500 mg total) by mouth 2 (two) times daily. 04/24/16   Sam, Ace Gins, PA-C  triamcinolone ointment (KENALOG) 0.1 % Apply 1 application topically 2 (two) times daily as needed (skin irritation).    [provider]    Past Medical History:  Diagnosis Date  . Allergy     Past Surgical History:  Procedure Laterality Date  . ABDOMINAL HYSTERECTOMY    . CESAREAN SECTION      Social History   Tobacco Use  . Smoking status: Current Every  Day Smoker    Packs/day: 1.00    Years: 25.00    Pack years: 25.00  . Smokeless tobacco: Never Used  . Tobacco comment: 25 PLUS years  Substance Use Topics  . Alcohol use: Yes    Alcohol/week: 0.5 - 1.0 oz    Types: 1 - 2 Standard drinks or equivalent per week    Family History  Adopted: Yes  Problem Relation Age of Onset  . Cancer Sister        late 67s, breast cancer  . Cancer Sister        early 69s, breast cancer    ROS Per hpi  OBJECTIVE:  Blood pressure 124/86, pulse 100, temperature 98.2 F (36.8 C), temperature source Oral, height  (1.549 m), weight 214 lb (97.1 kg), SpO2 97 %.  Physical Exam  Constitutional: She is oriented to person, place, and time. She appears well-developed and well-nourished.  HENT:  Head: Normocephalic and atraumatic.  Mouth/Throat: Oropharynx is clear and moist. No oropharyngeal exudate.  Eyes: Pupils are equal, round, and reactive to light. EOM and lids are normal. Right eye exhibits no chemosis and no discharge. Left eye exhibits no chemosis and no discharge. Right conjunctiva is injected. Left conjunctiva is injected. No scleral icterus.  Slit lamp exam:      The right eye  shows no fluorescein uptake.       The left eye shows no fluorescein uptake.  Neck: Neck supple.  Cardiovascular: Normal rate, regular rhythm and normal heart sounds. Exam reveals no gallop and no friction rub.  No murmur heard. Pulmonary/Chest: Effort normal and breath sounds normal. She has no wheezes. She has no rales.  Musculoskeletal: She exhibits no edema.  Neurological: She is alert and oriented to person, place, and time.  Skin: Skin is warm and dry.  Nursing note and vitals reviewed.   ASSESSMENT and PLAN  1. Acute atopic conjunctivitis of both eyes Discussed supportive measures, new meds r/se/b and RTC precautions. Patient educational handout given. - Olopatadine HCl 0.2 % SOLN; Apply 1 drop to eye daily.  Return if symptoms worsen or fail to  improve.    Myles Lipps, MD Primary Care at [facility] [address] [place], [place] [address] Ph.  [phone] Fax [phone]

## 2018-04-29 ENCOUNTER — Encounter: Payer: Self-pay | Admitting: Family Medicine

## 2018-05-25 ENCOUNTER — Ambulatory Visit (INDEPENDENT_AMBULATORY_CARE_PROVIDER_SITE_OTHER): Payer: BLUE CROSS/BLUE SHIELD

## 2018-05-25 ENCOUNTER — Ambulatory Visit (HOSPITAL_COMMUNITY)
Admission: EM | Admit: 2018-05-25 | Discharge: 2018-05-25 | Disposition: A | Discharge status: Home / Self Care | Payer: BLUE CROSS/BLUE SHIELD | Attending: Internal Medicine | Admitting: Internal Medicine

## 2018-05-25 ENCOUNTER — Encounter (HOSPITAL_COMMUNITY): Payer: Self-pay | Admitting: Emergency Medicine

## 2018-05-25 DIAGNOSIS — M545 Low back pain, unspecified: Secondary | ICD-10-CM

## 2018-05-25 DIAGNOSIS — R1030 Lower abdominal pain, unspecified: Secondary | ICD-10-CM | POA: Diagnosis not present

## 2018-05-25 LAB — POCT URINALYSIS DIP (DEVICE)
Bilirubin Urine: NEGATIVE
Glucose, UA: NEGATIVE mg/dL
Ketones, ur: NEGATIVE mg/dL
Leukocytes, UA: NEGATIVE
NITRITE: NEGATIVE
Protein, ur: NEGATIVE mg/dL
Specific Gravity, Urine: 1.025 (ref 1.005–1.030)
Urobilinogen, UA: 0.2 mg/dL (ref 0.0–1.0)
pH: 6 (ref 5.0–8.0)

## 2018-05-25 MED ORDER — NAPROXEN 500 MG PO TABS: 500.0000 mg | ORAL_TABLET | Freq: Two times a day (BID) | ORAL | 0 refills | Status: DC

## 2018-05-25 MED ORDER — CYCLOBENZAPRINE HCL 5 MG PO TABS: 5.0000 mg | ORAL_TABLET | Freq: Every day | ORAL | 0 refills | Status: DC

## 2018-05-25 MED ORDER — KETOROLAC TROMETHAMINE 60 MG/2ML IM SOLN
60.0000 mg | Freq: Once | INTRAMUSCULAR | Status: AC
  Administered 2018-05-25: 60 mg via INTRAMUSCULAR

## 2018-05-25 MED ORDER — KETOROLAC TROMETHAMINE 60 MG/2ML IM SOLN
INTRAMUSCULAR | Status: AC
  Filled 2018-05-25: qty 2

## 2018-05-25 NOTE — ED Triage Notes (Signed)
Pt sts right sided flank pain x 3 weeks worse with movement

## 2018-05-25 NOTE — Discharge Instructions (Signed)
Your xray is normal today which is reassuring. This still appears likely muscular in nature with some referred pain to abdomen.  Please follow up with your gynecologist for pelvis evaluation if this persists. Start naproxen twice a day, take with food. May use flexeril at night.  Please establish with a primary care provider as if this persists may need further imaging.  If develop severe pain, nausea, vomiting or otherwise worsening please go to Er.

## 2018-05-25 NOTE — ED Provider Notes (Signed)
[facility]    CSN: [id number] Arrival date & time: 05/25/18  1050     History   Chief Complaint Chief Complaint  Patient presents with  . Flank Pain    HPI Stacey Fuller is a 57 y.o. female.   [name] presents with complaints of right low back as well as right lower abdomen pain which has been present for the past 3 week. Worse with movements. She does lift at work. Pain improves with ibuprofen, last ibuprofen at 0300 this am which did help some. No urinary symptoms, no blood in urine. No nausea, vomiting diarrhea. Last BM yesterday and normal, denies constipation. No vaginal symptoms. Eating and drinking. Denies any previous similar. Pain primarily to back with just almost a referred type pain to RLQ. Has had a hysterectomy and states has only one ovary. Has Gynecologist but no PCP.    ROS per HPI.       Past Medical History:  Diagnosis Date  . Allergy     There are no active problems to display for this patient.   Past Surgical History:  Procedure Laterality Date  . ABDOMINAL HYSTERECTOMY    . CESAREAN SECTION      OB History   None      Home Medications    Prior to Admission medications   Medication Sig Start Date End Date Taking? Authorizing Provider  amoxicillin (AMOXIL) 500 MG capsule Take 1 capsule (500 mg total) by mouth 2 (two) times daily. 04/24/16   Sam, Ace Gins, PA-C  benzocaine-menthol (CHLORAEPTIC) 6-10 MG lozenge Take 1 lozenge by mouth as needed for sore throat. 04/24/16   Sam, Ace Gins, PA-C  cyclobenzaprine (FLEXERIL) 5 MG tablet Take 1 tablet (5 mg total) by mouth at bedtime. 05/25/18   Georgetta Haber, NP  esomeprazole (NEXIUM) 20 MG capsule Take 40 mg by mouth daily as needed (for acid reflex).    [provider]  ipratropium (ATROVENT) 0.03 % nasal spray Place 2 sprays into both nostrils 2 (two) times daily. 04/19/16   Dorna Leitz, PA-C  naproxen (NAPROSYN) 500 MG tablet Take 1 tablet (500 mg total) by mouth 2 (two)  times daily with a meal. 05/25/18   Burky, Dorene Grebe B, NP  Olopatadine HCl 0.2 % SOLN Apply 1 drop to eye daily. 04/16/18   Myles Lipps, MD  triamcinolone ointment (KENALOG) 0.1 % Apply 1 application topically 2 (two) times daily as needed (skin irritation).    [provider]    Family History Family History  Adopted: Yes  Problem Relation Age of Onset  . Cancer Sister        late 44s, breast cancer  . Cancer Sister        early 41s, breast cancer    Social History Social History   Tobacco Use  . Smoking status: Current Every Day Smoker    Packs/day: 1.00    Years: 25.00    Pack years: 25.00  . Smokeless tobacco: Never Used  . Tobacco comment: 25 PLUS years  Substance Use Topics  . Alcohol use: Yes    Alcohol/week: 0.6 - 1.2 oz    Types: 1 - 2 Standard drinks or equivalent per week  . Drug use: Yes    Comment: marijuana      Allergies   Patient has no known allergies.   Review of Systems Review of Systems   Physical Exam Triage Vital Signs ED Triage Vitals [05/25/18 1118]  Enc Vitals Group  BP (!) 147/87     Pulse Rate 72     Resp 18     Temp 98.6 F (37 C)     Temp Source Oral     SpO2 96 %     Weight      Height      Head Circumference      Peak Flow      Pain Score      Pain Loc      Pain Edu?      Excl. in GC?    No data found.  Updated Vital Signs BP (!) 147/87 (BP Location: Left Arm)   Pulse 72   Temp 98.6 F (37 C) (Oral)   Resp 18   SpO2 96%   Visual Acuity Right Eye Distance:   Left Eye Distance:   Bilateral Distance:    Right Eye Near:   Left Eye Near:    Bilateral Near:     Physical Exam  Constitutional: She is oriented to person, place, and time. She appears well-developed and well-nourished. No distress.  Cardiovascular: Normal rate, regular rhythm and normal heart sounds.  Pulmonary/Chest: Effort normal and breath sounds normal.  Abdominal: Soft. Bowel sounds are normal. There is no tenderness. There is  no rigidity, no rebound, no guarding, no CVA tenderness, no tenderness at McBurney's point and negative Murphy's sign.  Musculoskeletal:       Lumbar back: She exhibits decreased range of motion and pain. She exhibits no tenderness and no bony tenderness.       Back:  Right low back pain, not increased with palpation; pain with movement, visible discomfort with stepping up onto exam table and even worse with transitioning from sitting to laying; no pain with hip flexion or straight leg raise; sensation intact and strength equal to bilateral lower extremities   Neurological: She is alert and oriented to person, place, and time.  Skin: Skin is warm and dry.     UC Treatments / Results  Labs (all labs ordered are listed, but only abnormal results are displayed) Labs Reviewed  POCT URINALYSIS DIP (DEVICE) - Abnormal; Notable for the following components:      Result Value   Hgb urine dipstick TRACE (*)    All other components within normal limits    EKG None  Radiology Dg Abd 1 View  Result Date: 05/25/2018 CLINICAL DATA:  Right lower flank pain, low back pain. EXAM: ABDOMEN - 1 VIEW COMPARISON:  09/09/2014 FINDINGS: The bowel gas pattern is normal. No radio-opaque calculi or other significant radiographic abnormality are seen. IMPRESSION: Negative. Electronically Signed   By: Charlett Nose M.D.   On: 05/25/2018 12:49    Procedures Procedures (including critical care time)  Medications Ordered in UC Medications  ketorolac (TORADOL) injection 60 mg (60 mg Intramuscular Given 05/25/18 1227)    Initial Impression / Assessment and Plan / UC Course  I have reviewed the triage vital signs and the nursing notes.  Pertinent labs & imaging results that were available during my care of the patient were reviewed by me and considered in my medical decision making (see chart for details).     Non toxic in appearance. Afebrile. No urinary or vaginal symptoms. No nausea, vomiting or diarrhea.  Low back pain, worse with movements which does appear musculoskeletal in nature. Trace hgb in urine however, abd film obtained to look at stool patterns as well to rule out large visible kidney stone presence. Film without acute findings. toradol  took pain away, patient states feels much improved. No red flag findings at this time, feel patient can follow up as outpatient for continued evaluation. Return precautions provided. Patient verbalized understanding and agreeable to plan. Ambulatory out of clinic without difficulty.      Final Clinical Impressions(s) / UC Diagnoses   Final diagnoses:  Acute right-sided low back pain without sciatica     Discharge Instructions     Your xray is normal today which is reassuring. This still appears likely muscular in nature with some referred pain to abdomen.  Please follow up with your gynecologist for pelvis evaluation if this persists. Start naproxen twice a day, take with food. May use flexeril at night.  Please establish with a primary care provider as if this persists may need further imaging.  If develop severe pain, nausea, vomiting or otherwise worsening please go to Er.     ED Prescriptions    Medication Sig Dispense Auth. Provider   naproxen (NAPROSYN) 500 MG tablet  (Status: Discontinued) Take 1 tablet (500 mg total) by mouth 2 (two) times daily with a meal. 30 tablet Burky, Natalie B, NP   cyclobenzaprine (FLEXERIL) 5 MG tablet Take 1 tablet (5 mg total) by mouth at bedtime. 15 tablet Linus MakoBurky, Natalie B, NP   naproxen (NAPROSYN) 500 MG tablet Take 1 tablet (500 mg total) by mouth 2 (two) times daily with a meal. 30 tablet Linus MakoBurky, Natalie B, NP     Controlled Substance Prescriptions [place] Controlled Substance Registry consulted? Not Applicable   Georgetta HaberBurky, Natalie B, NP 05/25/18 1305

## 2018-05-29 DIAGNOSIS — R1031 Right lower quadrant pain: Secondary | ICD-10-CM | POA: Diagnosis not present

## 2018-06-11 ENCOUNTER — Emergency Department (HOSPITAL_COMMUNITY)
Admission: EM | Admit: 2018-06-11 | Discharge: 2018-06-11 | Disposition: A | Discharge status: Home / Self Care | Payer: BLUE CROSS/BLUE SHIELD | Attending: Emergency Medicine | Admitting: Emergency Medicine

## 2018-06-11 ENCOUNTER — Emergency Department (HOSPITAL_COMMUNITY): Payer: BLUE CROSS/BLUE SHIELD

## 2018-06-11 ENCOUNTER — Encounter (HOSPITAL_COMMUNITY): Payer: Self-pay

## 2018-06-11 ENCOUNTER — Other Ambulatory Visit: Payer: Self-pay

## 2018-06-11 DIAGNOSIS — R109 Unspecified abdominal pain: Secondary | ICD-10-CM

## 2018-06-11 DIAGNOSIS — Z79899 Other long term (current) drug therapy: Secondary | ICD-10-CM | POA: Insufficient documentation

## 2018-06-11 DIAGNOSIS — R1031 Right lower quadrant pain: Secondary | ICD-10-CM | POA: Insufficient documentation

## 2018-06-11 DIAGNOSIS — F17200 Nicotine dependence, unspecified, uncomplicated: Secondary | ICD-10-CM | POA: Insufficient documentation

## 2018-06-11 LAB — URINALYSIS, ROUTINE W REFLEX MICROSCOPIC
Bilirubin Urine: NEGATIVE
Glucose, UA: NEGATIVE mg/dL
Hgb urine dipstick: NEGATIVE
Ketones, ur: NEGATIVE mg/dL
Leukocytes, UA: NEGATIVE
Nitrite: NEGATIVE
PROTEIN: NEGATIVE mg/dL
SPECIFIC GRAVITY, URINE: 1.023 (ref 1.005–1.030)
pH: 7 (ref 5.0–8.0)

## 2018-06-11 LAB — COMPREHENSIVE METABOLIC PANEL
ALBUMIN: 4 g/dL (ref 3.5–5.0)
ALT: 23 U/L (ref 0–44)
AST: 20 U/L (ref 15–41)
Alkaline Phosphatase: 94 U/L (ref 38–126)
Anion gap: 7 (ref 5–15)
BILIRUBIN TOTAL: 0.4 mg/dL (ref 0.3–1.2)
BUN: 17 mg/dL (ref 6–20)
CHLORIDE: 106 mmol/L (ref 98–111)
CO2: 29 mmol/L (ref 22–32)
Calcium: 9.3 mg/dL (ref 8.9–10.3)
Creatinine, Ser: 0.6 mg/dL (ref 0.44–1.00)
GFR calc Af Amer: >60 mL/min (ref >60)
GFR calc non Af Amer: >60 mL/min (ref >60)
Glucose, Bld: 100 mg/dL — ABNORMAL HIGH (ref 70–99)
POTASSIUM: 3.9 mmol/L (ref 3.5–5.1)
Sodium: 142 mmol/L (ref 135–145)
Total Protein: 7.4 g/dL (ref 6.5–8.1)

## 2018-06-11 LAB — CBC WITH DIFFERENTIAL/PLATELET
BASOS ABS: 0 10*3/uL (ref 0.0–0.1)
Basophils Relative: 1 %
Eosinophils Absolute: 0.2 10*3/uL (ref 0.0–0.7)
Eosinophils Relative: 5 %
HEMATOCRIT: 42.4 % (ref 36.0–46.0)
Hemoglobin: 13.9 g/dL (ref 12.0–15.0)
Lymphocytes Relative: 39 %
Lymphs Abs: 1.6 10*3/uL (ref 0.7–4.0)
MCH: 29.5 pg (ref 26.0–34.0)
MCHC: 32.8 g/dL (ref 30.0–36.0)
MCV: 90 fL (ref 78.0–100.0)
Monocytes Absolute: 0.3 10*3/uL (ref 0.1–1.0)
Monocytes Relative: 8 %
NEUTROS ABS: 2 10*3/uL (ref 1.7–7.7)
Neutrophils Relative %: 49 %
Platelets: 260 10*3/uL (ref 150–400)
RBC: 4.71 MIL/uL (ref 3.87–5.11)
RDW: 13.3 % (ref 11.5–15.5)
WBC: 4.1 10*3/uL (ref 4.0–10.5)

## 2018-06-11 LAB — LIPASE, BLOOD: Lipase: 28 U/L (ref 11–51)

## 2018-06-11 MED ORDER — IOPAMIDOL (ISOVUE-300) INJECTION 61%
INTRAVENOUS | Status: AC
  Filled 2018-06-11: qty 100

## 2018-06-11 MED ORDER — MORPHINE SULFATE (PF) 4 MG/ML IV SOLN
4.0000 mg | Freq: Once | INTRAVENOUS | Status: AC
  Administered 2018-06-11: 4 mg via INTRAVENOUS
  Filled 2018-06-11: qty 1

## 2018-06-11 MED ORDER — TRIAMCINOLONE ACETONIDE 0.1 % EX OINT: 1.0000 "application " | TOPICAL_OINTMENT | Freq: Two times a day (BID) | CUTANEOUS | 0 refills | Status: DC | PRN

## 2018-06-11 MED ORDER — IOPAMIDOL (ISOVUE-300) INJECTION 61%
100.0000 mL | Freq: Once | INTRAVENOUS | Status: AC | PRN
  Administered 2018-06-11: 100 mL via INTRAVENOUS

## 2018-06-11 NOTE — ED Triage Notes (Signed)
Pt reports RLQ abd pain onset x 4 weeks denies event or injury states pain radiates to back. 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

## 2018-06-11 NOTE — ED Provider Notes (Signed)
[facility]-EMERGENCY DEPT Provider Note   CSN: [id number] Arrival date & time: 06/11/18  0106     History   Chief Complaint No chief complaint on file.   HPI Stacey Fuller is a 57 y.o. female.   The history is provided by the patient.  No language interpreter was used.     57 year old female presenting for evaluation of abdominal pain.  Patient report an ongoing pain to her right lower quadrant which has been present for the past month.  She described pain as a sharp throbbing sensation, persistent, sometimes worsening with movement and with lifting.  Pain is not associate with fever, chills, lightheadedness, dizziness, chest pain, no breathing, nausea vomiting diarrhea, change in bowel movement, dysuria or hematuria.  She was seen by urgent care center on 05/25/2018 for her symptoms.  It was felt that her symptoms may be muscle skeletal related to her job.  Patient was given NSAIDs, and muscle relaxant for her symptoms without adequate relief.  She also was seen by her OB/GYN over a week ago for same symptoms, and ultrasound was performed and no acute findings were noted.  Given the duration of her symptoms, patient presents requesting for further evaluation.  She denies any prior history of cancer.  She denies fever, night sweats, or weight changes.  Past Medical History:  Diagnosis Date  . Allergy     There are no active problems to display for this patient.   Past Surgical History:  Procedure Laterality Date  . ABDOMINAL HYSTERECTOMY    . CESAREAN SECTION       OB History   None      Home Medications    Prior to Admission medications   Medication Sig Start Date End Date Taking? Authorizing Provider  amoxicillin (AMOXIL) 500 MG capsule Take 1 capsule (500 mg total) by mouth 2 (two) times daily. 04/24/16   Sam, Ace GinsSerena Y, PA-C  benzocaine-menthol (CHLORAEPTIC) 6-10 MG lozenge Take 1 lozenge by mouth as needed for sore throat. 04/24/16   Sam, Ace GinsSerena Y,  PA-C  cyclobenzaprine (FLEXERIL) 5 MG tablet Take 1 tablet (5 mg total) by mouth at bedtime. 05/25/18   Georgetta HaberBurky, Natalie B, NP  esomeprazole (NEXIUM) 20 MG capsule Take 40 mg by mouth daily as needed (for acid reflex).    [provider]  ipratropium (ATROVENT) 0.03 % nasal spray Place 2 sprays into both nostrils 2 (two) times daily. 04/19/16   Dorna LeitzBush, Nicole V, PA-C  naproxen (NAPROSYN) 500 MG tablet Take 1 tablet (500 mg total) by mouth 2 (two) times daily with a meal. 05/25/18   Burky, Dorene GrebeNatalie B, NP  Olopatadine HCl 0.2 % SOLN Apply 1 drop to eye daily. 04/16/18   Myles LippsSantiago, Irma M, MD  triamcinolone ointment (KENALOG) 0.1 % Apply 1 application topically 2 (two) times daily as needed (skin irritation).    [provider]    Family History Family History  Adopted: Yes  Problem Relation Age of Onset  . Cancer Sister        late 7950s, breast cancer  . Cancer Sister        early 5250s, breast cancer    Social History Social History   Tobacco Use  . Smoking status: Current Every Day Smoker    Packs/day: 1.00    Years: 25.00    Pack years: 25.00  . Smokeless tobacco: Never Used  . Tobacco comment: 25 PLUS years  Substance Use Topics  . Alcohol use: Yes  Alcohol/week: 0.6 - 1.2 oz    Types: 1 - 2 Standard drinks or equivalent per week  . Drug use: Yes    Comment: marijuana      Allergies   Patient has no known allergies.   Review of Systems Review of Systems  All other systems reviewed and are negative.    Physical Exam Updated Vital Signs BP (!) 154/96 (BP Location: Left Arm)   Pulse 87   Temp 98.4 F (36.9 C) (Oral)   Resp 16   Ht 5\' 1"  (1.549 m)   Wt 97.5 kg (215 lb)   SpO2 97%   BMI 40.62 kg/m    Physical Exam  Constitutional: She is oriented to person, place, and time. She appears well-developed and well-nourished. No distress.  HENT:  Head: Atraumatic.  Eyes: Conjunctivae are normal.  Neck: Neck supple.  Cardiovascular: Normal rate and  regular rhythm.  Pulmonary/Chest: Effort normal and breath sounds normal.  Abdominal: Soft. Bowel sounds are normal. She exhibits no distension. There is tenderness (Mild right lower quadrant tenderness without guarding or rebound tenderness.  Negative Murphy signs, no pain at McBurney's point.).  Neurological: She is alert and oriented to person, place, and time.  Skin: No rash noted.  Psychiatric: She has a normal mood and affect.  Nursing note and vitals reviewed.    ED Treatments / Results  Labs (all labs ordered are listed, but only abnormal results are displayed) Labs Reviewed  COMPREHENSIVE METABOLIC PANEL - Abnormal; Notable for the following components:      Result Value   Glucose, Bld 100 (*)    All other components within normal limits  CBC WITH DIFFERENTIAL/PLATELET  LIPASE, BLOOD  URINALYSIS, ROUTINE W REFLEX MICROSCOPIC    EKG None  Radiology Ct Abdomen Pelvis W Contrast  Result Date: 06/11/2018 CLINICAL DATA:  Right lower quadrant pain for 4 weeks. EXAM: CT ABDOMEN AND PELVIS WITH CONTRAST TECHNIQUE: Multidetector CT imaging of the abdomen and pelvis was performed using the standard protocol following bolus administration of intravenous contrast. CONTRAST:  ISOVUE-300 IOPAMIDOL (ISOVUE-300) INJECTION 61% COMPARISON:  None. FINDINGS: Lower chest: Lung bases are clear. Hepatobiliary: Multiple small low-attenuation lesions scattered throughout the liver. Largest measures about 13 mm diameter. These are likely small cysts. No solid mass identified. Gallbladder and bile ducts are unremarkable. Pancreas: Unremarkable. No pancreatic ductal dilatation or surrounding inflammatory changes. Spleen: Normal in size without focal abnormality. Adrenals/Urinary Tract: No adrenal gland nodules. Subcentimeter cysts in the kidneys. Nephrograms are symmetrical. No hydronephrosis or hydroureter. Bladder wall is not thickened. No bladder filling defects. Stomach/Bowel: Stomach, small  bowel, and colon are not abnormally distended. Scattered stool throughout the colon. Colonic diverticulosis without evidence of diverticulitis. Appendix is normal Vascular/Lymphatic: No significant vascular findings are present. No enlarged abdominal or pelvic lymph nodes. Reproductive: Status post hysterectomy. No adnexal masses. Other: No abdominal wall hernia or abnormality. No abdominopelvic ascites. Musculoskeletal: No acute or significant osseous findings. IMPRESSION: 1. No acute process identified. No evidence of bowel obstruction or inflammation. 2. Multiple small cysts in the liver and kidneys. Electronically Signed   By: Burman Nieves M.D.   On: 06/11/2018 04:46    Procedures Procedures (including critical care time)  Medications Ordered in ED Medications  iopamidol (ISOVUE-300) 61 % injection (has no administration in time range)  morphine 4 MG/ML injection 4 mg (4 mg Intravenous Given 06/11/18 0217)  iopamidol (ISOVUE-300) 61 % injection 100 mL (100 mLs Intravenous Contrast Given 06/11/18 0421)  Initial Impression / Assessment and Plan / ED Course  I have reviewed the triage vital signs and the nursing notes.  Pertinent labs & imaging results that were available during my care of the patient were reviewed by me and considered in my medical decision making (see chart for details).     BP 131/86   Pulse 70   Temp 98.4 F (36.9 C) (Oral)   Resp 18   Ht 5\' 1"  (1.549 m)   Wt 97.5 kg (215 lb)   SpO2 99%   BMI 40.62 kg/m     Final Clinical Impressions(s) / ED Diagnoses   Final diagnoses:  Recurrent abdominal pain    ED Discharge Orders    None     1:48 AM Patient here with a persistent pain to her right lower quadrant.  She has been seen at urgent care as well as her OB/GYN for the same symptoms.  Her symptom has persisted and currently rates as 10 out of 10.  Given the prolonged duration of her condition, I will obtain abdominal pelvic CT scan for further  evaluation.  5:19 AM Labs are reassuring, abdominal and pelvis CT scan without any acute process identified.  Multiple small cyst in the liver and kidneys were noted.  I discussed this finding with patient.  She does have an appointment with her  Gastroenterologist in 2 weeks in which she will discuss this finding with her doctor.  Otherwise she is stable for discharge.  Return precautions discussed.  Fayrene Helper, PA-C 06/11/18 4098  Nicanor Alcon, April, MD 06/11/18 [phone]

## 2018-06-11 NOTE — Discharge Instructions (Addendum)
You have been evaluated for your abdominal pain.  No acute changes were noted.  There are several small cysts in your liver and kidneys. Please discuss this with your GI specialist during your next appointment. Return if you have any concerns.

## 2018-06-17 DIAGNOSIS — R1084 Generalized abdominal pain: Secondary | ICD-10-CM | POA: Diagnosis not present

## 2018-06-23 DIAGNOSIS — Z86010 Personal history of colonic polyps: Secondary | ICD-10-CM | POA: Diagnosis not present

## 2018-06-23 DIAGNOSIS — Z1211 Encounter for screening for malignant neoplasm of colon: Secondary | ICD-10-CM | POA: Diagnosis not present

## 2018-06-23 DIAGNOSIS — K5730 Diverticulosis of large intestine without perforation or abscess without bleeding: Secondary | ICD-10-CM | POA: Diagnosis not present

## 2018-07-08 DIAGNOSIS — K5730 Diverticulosis of large intestine without perforation or abscess without bleeding: Secondary | ICD-10-CM | POA: Diagnosis not present

## 2018-07-08 DIAGNOSIS — D125 Benign neoplasm of sigmoid colon: Secondary | ICD-10-CM | POA: Diagnosis not present

## 2018-07-08 DIAGNOSIS — Z1211 Encounter for screening for malignant neoplasm of colon: Secondary | ICD-10-CM | POA: Diagnosis not present

## 2018-07-08 DIAGNOSIS — K635 Polyp of colon: Secondary | ICD-10-CM | POA: Diagnosis not present

## 2019-10-05 ENCOUNTER — Other Ambulatory Visit: Payer: Self-pay

## 2019-10-05 DIAGNOSIS — Z20822 Contact with and (suspected) exposure to covid-19: Secondary | ICD-10-CM

## 2019-10-07 LAB — NOVEL CORONAVIRUS, NAA: SARS-CoV-2, NAA: NOT DETECTED

## 2020-01-09 IMAGING — CT CT ABD-PELV W/ CM
2 of 5 series · 16 of 46 positions shown, 18 images · IV contrast (ISOVUE)
Comparison: None.

CLINICAL DATA: Right lower quadrant pain for 4 weeks.

EXAM:
CT ABDOMEN AND PELVIS WITH CONTRAST
TECHNIQUE: Multidetector CT imaging of the abdomen and pelvis was performed
using the standard protocol following bolus administration of
intravenous contrast.
CONTRAST:  100mL YBP3AO-911 IOPAMIDOL (YBP3AO-911) INJECTION 61%

[Series 2: axial st · axial · 0.71mm/px · z∈[+1184,+1544]mm · 13 of 84 slices shown, 15 images]
[im 6/84  soft-tissue]
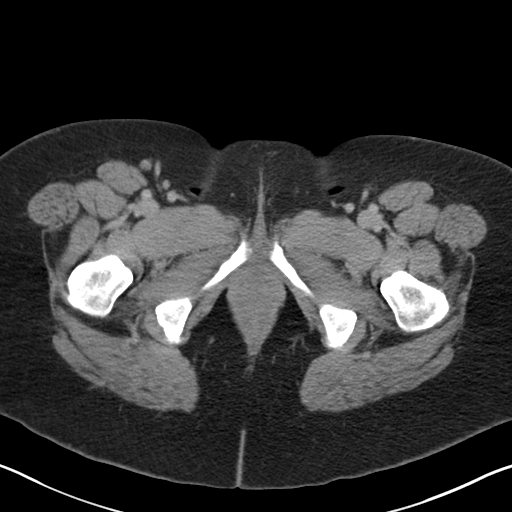
[im 6/84  bone]
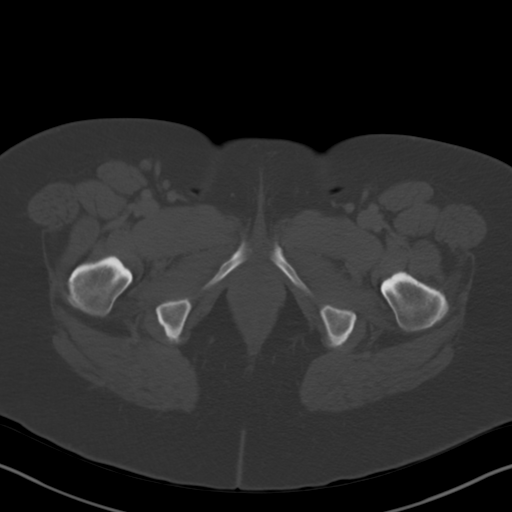
[im 11/84  soft-tissue]
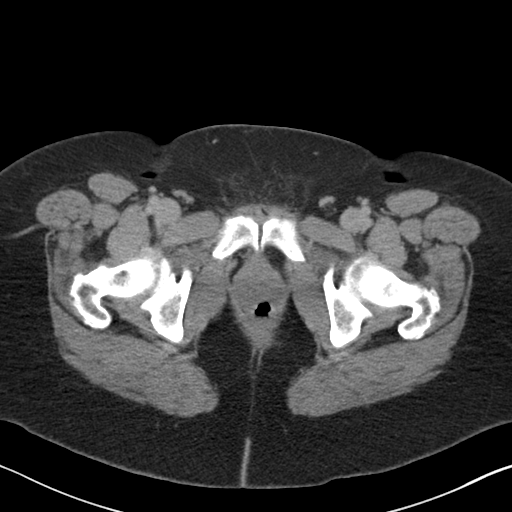
[im 16/84  soft-tissue]
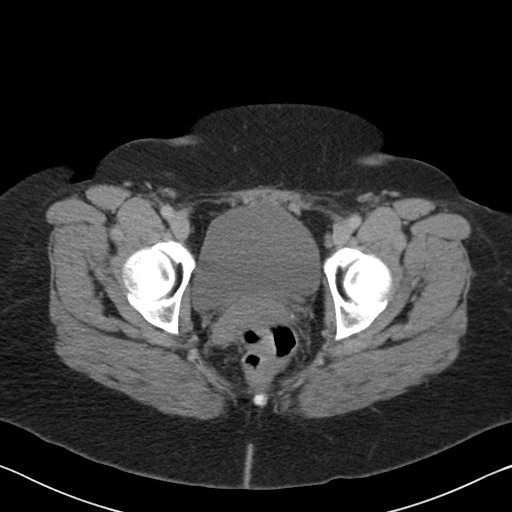
[im 26/84  soft-tissue]
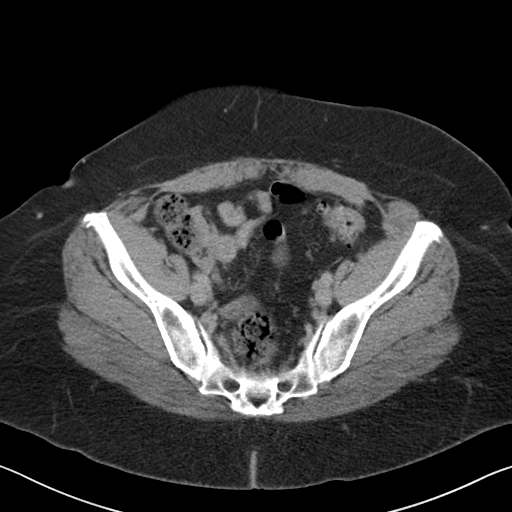
[im 32/84  soft-tissue]
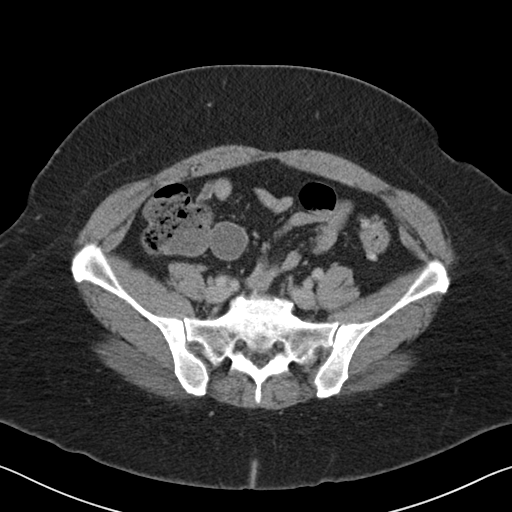
[im 37/84  soft-tissue]
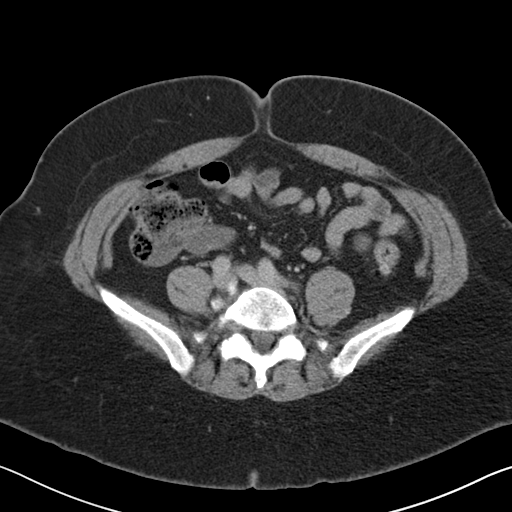
[im 42/84  soft-tissue]
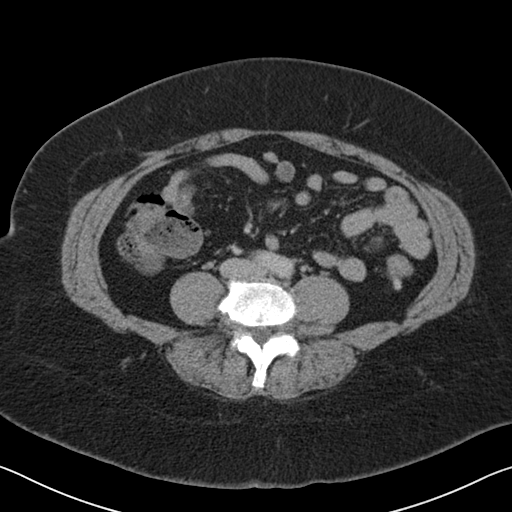
[im 47/84  soft-tissue]
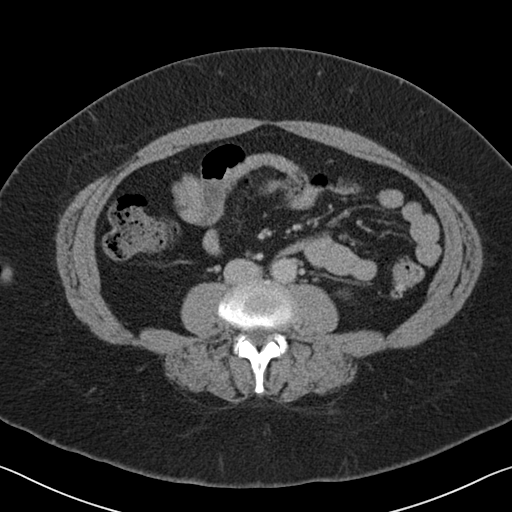
[im 52/84  soft-tissue]
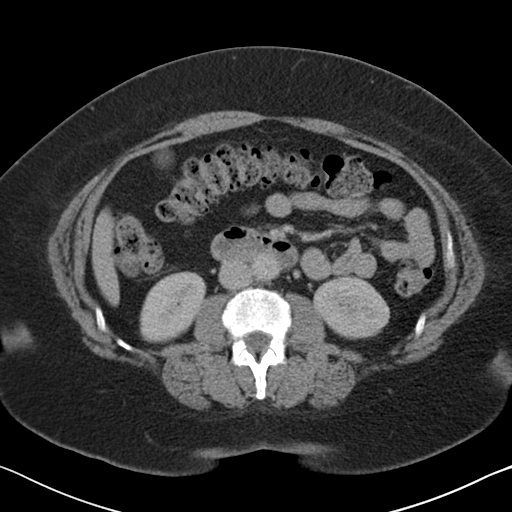
[im 52/84  bone]
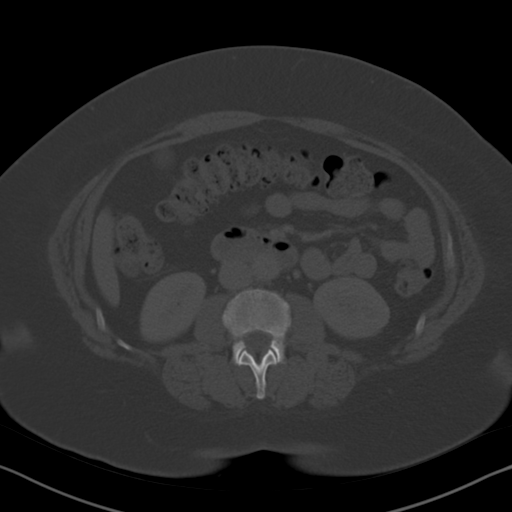
[im 58/84  soft-tissue]
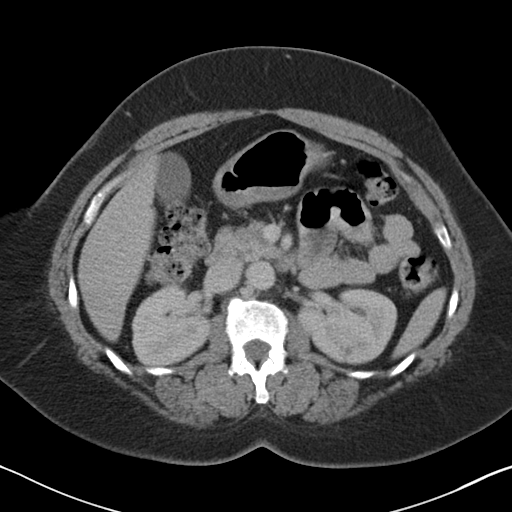
[im 68/84  soft-tissue]
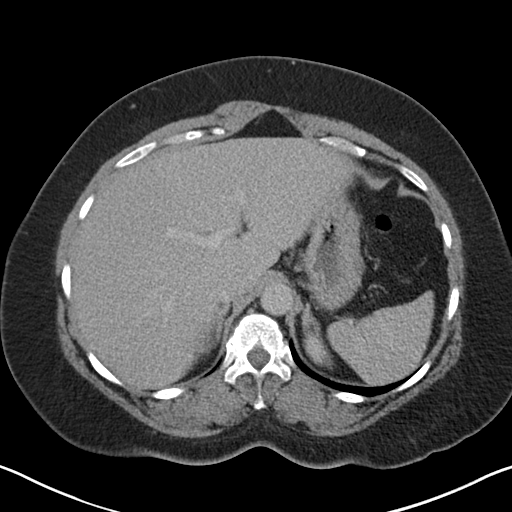
[im 73/84  soft-tissue]
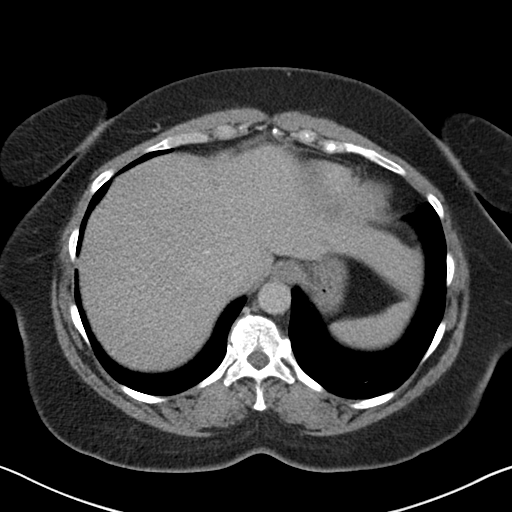
[im 78/84  soft-tissue]
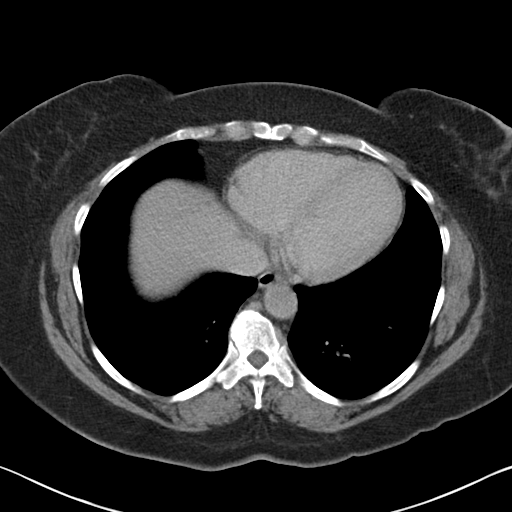

[Series 5: coronal st · coronal · 0.73mm/px · 3 of 95 slices shown]
[im 32/95  soft-tissue]
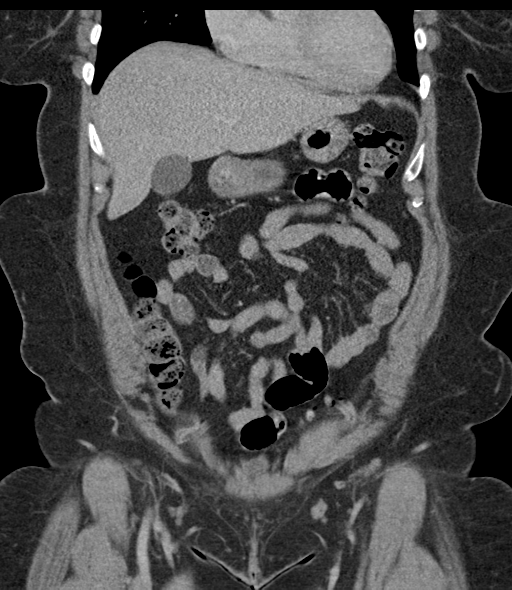
[im 42/95  soft-tissue]
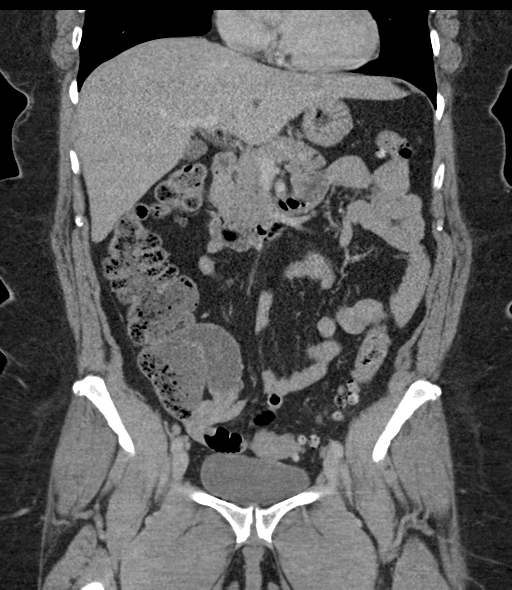
[im 53/95  soft-tissue]
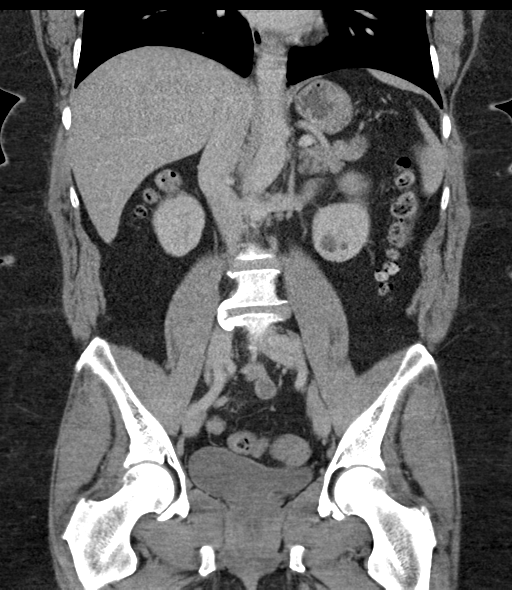

[16 of 46 positions shown; findings below may reference images not displayed]

FINDINGS: Lower chest: Lung bases are clear.

Hepatobiliary: Multiple small low-attenuation lesions scattered
throughout the liver. Largest measures about 13 mm diameter. These
are likely small cysts. No solid mass identified. Gallbladder and
bile ducts are unremarkable.

Pancreas: Unremarkable. No pancreatic ductal dilatation or
surrounding inflammatory changes.

Spleen: Normal in size without focal abnormality.

Adrenals/Urinary Tract: No adrenal gland nodules. Subcentimeter
cysts in the kidneys. Nephrograms are symmetrical. No hydronephrosis
or hydroureter. Bladder wall is not thickened. No bladder filling
defects.

Stomach/Bowel: Stomach, small bowel, and colon are not abnormally
distended. Scattered stool throughout the colon. Colonic
diverticulosis without evidence of diverticulitis. Appendix is
normal

Vascular/Lymphatic: No significant vascular findings are present. No
enlarged abdominal or pelvic lymph nodes.

Reproductive: Status post hysterectomy. No adnexal masses.

Other: No abdominal wall hernia or abnormality. No abdominopelvic
ascites.

Musculoskeletal: No acute or significant osseous findings.
IMPRESSION: 1. No acute process identified. No evidence of bowel obstruction or
inflammation.
2. Multiple small cysts in the liver and kidneys.

## 2020-05-29 ENCOUNTER — Encounter (HOSPITAL_COMMUNITY): Payer: Self-pay

## 2020-05-29 ENCOUNTER — Other Ambulatory Visit: Payer: Self-pay

## 2020-05-29 ENCOUNTER — Emergency Department (HOSPITAL_COMMUNITY)
Admission: EM | Admit: 2020-05-29 | Discharge: 2020-05-29 | Disposition: A | Discharge status: Home / Self Care | Payer: No Typology Code available for payment source | Attending: Emergency Medicine | Admitting: Emergency Medicine

## 2020-05-29 ENCOUNTER — Emergency Department (HOSPITAL_COMMUNITY): Payer: No Typology Code available for payment source

## 2020-05-29 DIAGNOSIS — F17210 Nicotine dependence, cigarettes, uncomplicated: Secondary | ICD-10-CM | POA: Diagnosis not present

## 2020-05-29 DIAGNOSIS — R112 Nausea with vomiting, unspecified: Secondary | ICD-10-CM | POA: Diagnosis not present

## 2020-05-29 DIAGNOSIS — R0789 Other chest pain: Secondary | ICD-10-CM | POA: Diagnosis present

## 2020-05-29 DIAGNOSIS — R079 Chest pain, unspecified: Secondary | ICD-10-CM

## 2020-05-29 LAB — COMPREHENSIVE METABOLIC PANEL
ALT: 22 U/L (ref 0–44)
AST: 19 U/L (ref 15–41)
Albumin: 4 g/dL (ref 3.5–5.0)
Alkaline Phosphatase: 85 U/L (ref 38–126)
Anion gap: 11 (ref 5–15)
BUN: 17 mg/dL (ref 6–20)
CO2: 27 mmol/L (ref 22–32)
Calcium: 8.9 mg/dL (ref 8.9–10.3)
Chloride: 101 mmol/L (ref 98–111)
Creatinine, Ser: 0.63 mg/dL (ref 0.44–1.00)
GFR calc Af Amer: >60 mL/min (ref >60)
GFR calc non Af Amer: >60 mL/min (ref >60)
Glucose, Bld: 95 mg/dL (ref 70–99)
Potassium: 3.9 mmol/L (ref 3.5–5.1)
Sodium: 139 mmol/L (ref 135–145)
Total Bilirubin: 0.5 mg/dL (ref 0.3–1.2)
Total Protein: 7 g/dL (ref 6.5–8.1)

## 2020-05-29 LAB — URINALYSIS, ROUTINE W REFLEX MICROSCOPIC
Bilirubin Urine: NEGATIVE
Glucose, UA: NEGATIVE mg/dL
Ketones, ur: NEGATIVE mg/dL
Leukocytes,Ua: NEGATIVE
Nitrite: NEGATIVE
Protein, ur: NEGATIVE mg/dL
Specific Gravity, Urine: 1.02 (ref 1.005–1.030)
pH: 5 (ref 5.0–8.0)

## 2020-05-29 LAB — CBC
HCT: 42.1 % (ref 36.0–46.0)
Hemoglobin: 13.2 g/dL (ref 12.0–15.0)
MCH: 29 pg (ref 26.0–34.0)
MCHC: 31.4 g/dL (ref 30.0–36.0)
MCV: 92.5 fL (ref 80.0–100.0)
Platelets: 243 10*3/uL (ref 150–400)
RBC: 4.55 MIL/uL (ref 3.87–5.11)
RDW: 13 % (ref 11.5–15.5)
WBC: 4.8 10*3/uL (ref 4.0–10.5)
nRBC: 0 % (ref 0.0–0.2)

## 2020-05-29 LAB — I-STAT BETA HCG BLOOD, ED (MC, WL, AP ONLY): I-stat hCG, quantitative: <5 m[IU]/mL (ref <5)

## 2020-05-29 LAB — TROPONIN I (HIGH SENSITIVITY): Troponin I (High Sensitivity): 2 ng/L (ref <18)

## 2020-05-29 LAB — LIPASE, BLOOD: Lipase: 24 U/L (ref 11–51)

## 2020-05-29 MED ORDER — SODIUM CHLORIDE 0.9% FLUSH
3.0000 mL | Freq: Once | INTRAVENOUS | Status: AC
  Administered 2020-05-29: 3 mL via INTRAVENOUS

## 2020-05-29 MED ORDER — ESOMEPRAZOLE MAGNESIUM 20 MG PO CPDR: 20.0000 mg | DELAYED_RELEASE_CAPSULE | Freq: Every day | ORAL | 0 refills | Status: DC

## 2020-05-29 NOTE — ED Provider Notes (Signed)
[facility]-EMERGENCY DEPT Provider Note   CSN: [id number] Arrival date & time: 05/29/20  0037     History Chief Complaint  Patient presents with  . Chest Pain    Stacey Fuller is a 59 y.o. female.  Reports complaints of chest pain.  Patient states over the weekend while she was at work she noted intermittent episodes of chest burning sensation, also dull and achy.  Had some nausea and on Friday had an episode of nonbloody nonbilious emesis.  No vomiting since then.  Currently no ongoing pain.  Seems to be triggered by some foods but also occurs not associated with meals.  Says that she took a Nexium but this did not seem to make any improvements.  She denies past history of CAD, does not know her parents, but is unaware of any family history of CAD.  Does smoke.  Denies any other medical problems.  HPI  HPI: A 59 year old patient with a history of obesity presents for evaluation of chest pain. Initial onset of pain was more than 6 hours ago. The patient's chest pain is not worse with exertion. The patient complains of nausea. The patient's chest pain is middle- or left-sided, is not well-localized, is not described as heaviness/pressure/tightness, is not sharp and does not radiate to the arms/jaw/neck. The patient denies diaphoresis. The patient has smoked in the past 90 days. The patient has no history of stroke, has no history of peripheral artery disease, denies any history of treated diabetes, has no relevant family history of coronary artery disease (first degree relative at less than age 26), is not hypertensive and has no history of hypercholesterolemia.   Past Medical History:  Diagnosis Date  . Allergy     There are no problems to display for this patient.   Past Surgical History:  Procedure Laterality Date  . ABDOMINAL HYSTERECTOMY    . CESAREAN SECTION       OB History   No obstetric history on file.     Family History  Adopted: Yes  Problem  Relation Age of Onset  . Cancer Sister        late 38s, breast cancer  . Cancer Sister        early 2s, breast cancer    Social History   Tobacco Use  . Smoking status: Current Every Day Smoker    Packs/day: 1.00    Years: 25.00    Pack years: 25.00  . Smokeless tobacco: Never Used  . Tobacco comment: 25 PLUS years  Vaping Use  . Vaping Use: Never used  Substance Use Topics  . Alcohol use: Yes    Alcohol/week: 1.0 - 2.0 standard drink    Types: 1 - 2 Standard drinks or equivalent per week  . Drug use: Not Currently    Comment: marijuana     Home Medications Prior to Admission medications   Medication Sig Start Date End Date Taking? Authorizing Provider  cyclobenzaprine (FLEXERIL) 5 MG tablet Take 1 tablet (5 mg total) by mouth at bedtime. 05/25/18   Georgetta Haber, NP  esomeprazole (NEXIUM) 20 MG capsule Take 1 capsule (20 mg total) by mouth daily. 05/29/20   Milagros Loll, MD  naproxen (NAPROSYN) 500 MG tablet Take 1 tablet (500 mg total) by mouth 2 (two) times daily with a meal. 05/25/18   Linus Mako B, NP  triamcinolone ointment (KENALOG) 0.1 % Apply 1 application topically 2 (two) times daily as needed (skin irritation). 06/11/18  Domenic Moras, PA-C    Allergies    Patient has no known allergies.  Review of Systems   Review of Systems  Constitutional: Negative for chills and fever.  HENT: Negative for ear pain and sore throat.   Eyes: Negative for pain and visual disturbance.  Respiratory: Negative for cough and shortness of breath.   Cardiovascular: Positive for chest pain. Negative for palpitations.  Gastrointestinal: Positive for nausea and vomiting. Negative for abdominal pain.  Genitourinary: Negative for dysuria and hematuria.  Musculoskeletal: Negative for arthralgias and back pain.  Skin: Negative for color change and rash.  Neurological: Negative for seizures and syncope.  All other systems reviewed and are negative.   Physical Exam Updated  Vital Signs BP (!) 163/100   Pulse 62   Temp 98 F (36.7 C) (Oral)   Resp 20   Ht 5\' 1"  (1.549 m)   Wt 98.9 kg   SpO2 99%   BMI 41.19 kg/m   Physical Exam Vitals and nursing note reviewed.  Constitutional:      General: She is not in acute distress.    Appearance: She is well-developed.  HENT:     Head: Normocephalic and atraumatic.  Eyes:     Conjunctiva/sclera: Conjunctivae normal.  Cardiovascular:     Rate and Rhythm: Normal rate and regular rhythm.     Heart sounds: No murmur heard.   Pulmonary:     Effort: Pulmonary effort is normal. No respiratory distress.     Breath sounds: Normal breath sounds.  Abdominal:     Palpations: Abdomen is soft.     Tenderness: There is no abdominal tenderness.  Musculoskeletal:     Cervical back: Neck supple.  Skin:    General: Skin is warm and dry.     Capillary Refill: Capillary refill takes less than 2 seconds.  Neurological:     General: No focal deficit present.     Mental Status: She is alert.  Psychiatric:        Mood and Affect: Mood normal.        Behavior: Behavior normal.     ED Results / Procedures / Treatments   Labs (all labs ordered are listed, but only abnormal results are displayed) Labs Reviewed  URINALYSIS, ROUTINE W REFLEX MICROSCOPIC - Abnormal; Notable for the following components:      Result Value   Hgb urine dipstick SMALL (*)    Bacteria, UA RARE (*)    All other components within normal limits  CBC  COMPREHENSIVE METABOLIC PANEL  LIPASE, BLOOD  I-STAT BETA HCG BLOOD, ED ([facility], [facility], [facility] ONLY)  TROPONIN I (HIGH SENSITIVITY)  TROPONIN I (HIGH SENSITIVITY)    EKG EKG Interpretation  Date/Time:  Monday May 29 2020 07:35:17 EDT Ventricular Rate:  62 PR Interval:    QRS Duration: 93 QT Interval:  413 QTC Calculation: 420 R Axis:   26 Text Interpretation: Sinus rhythm Borderline prolonged PR interval no acute stemi` Confirmed by Madalyn Rob [phone]) on 05/29/2020 9:39:21  AM   Radiology DG Chest 2 View  Result Date: 05/29/2020 CLINICAL DATA:  Chest pain. EXAM: CHEST - 2 VIEW COMPARISON:  Prior chest radiograph 09/09/2014 FINDINGS: Heart size within normal limits. There is no appreciable airspace consolidation. No evidence of pleural effusion or pneumothorax. No acute bony abnormality identified. IMPRESSION: No evidence of acute cardiopulmonary abnormality. Electronically Signed   By: Kellie Simmering DO   On: 05/29/2020 08:13    Procedures Procedures (including critical care time)  Medications  Ordered in ED Medications  sodium chloride flush (NS) 0.9 % injection 3 mL (3 mLs Intravenous Given 05/29/20 1975)    ED Course  I have reviewed the triage vital signs and the nursing notes.  Pertinent labs & imaging results that were available during my care of the patient were reviewed by me and considered in my medical decision making (see chart for details).  Clinical Course as of May 29 1010  Mon May 29, 2020  0947 Rechecked, no ongoing symptoms, updated on results   [RD]    Clinical Course User Index [RD] Milagros Loll, MD   MDM Rules/Calculators/A&P HEAR Score: 3                        59 year old lady past medical history tobacco abuse but no other medical problems presents to ER with concern for episode of chest pain, nausea and one episode of vomiting couple days ago.  In ER, patient noted to be well-appearing with no ongoing symptoms.  Vital signs stable.  Lab work grossly normal.  Notably EKG without acute ischemic change, troponin II, given description of symptoms, these findings, highly doubt ACS or cardiac etiology.  CXR negative.  Given description of symptoms, higher clinical suspicion for gastric reflux disease.  Recommended recheck with primary doctor, her gastroenterologist, trial of scheduled PPI.  Reviewed return precautions in detail with patient and discharged home.   After the discussed management above, the patient was determined to  be safe for discharge.  The patient was in agreement with this plan and all questions regarding their care were answered.  ED return precautions were discussed and the patient will return to the ED with any significant worsening of condition.   Final Clinical Impression(s) / ED Diagnoses Final diagnoses:  Chest pain, unspecified type    Rx / DC Orders ED Discharge Orders         Ordered    esomeprazole (NEXIUM) 20 MG capsule  Daily     Discontinue  Reprint     05/29/20 0945           Milagros Loll, MD 05/29/20 1015

## 2020-05-29 NOTE — ED Triage Notes (Signed)
Patient c/o intermittent mid chest pain that radiates into her back x 2 days.  Patient states she had N/V 2 days ago.

## 2020-05-29 NOTE — Discharge Instructions (Signed)
Recommend following up with a primary care doctor as well as your gastroenterologist.  If you have recurrent chest pain, difficulty breathing, or other new concerning symptom, recommend return to ER for reassessment.  For the possibility of gastric reflux, recommend taking the proton pump inhibitor such as Nexium daily for the next month or until further instructed by your GI.

## 2020-10-02 ENCOUNTER — Observation Stay (HOSPITAL_COMMUNITY)
Admission: EM | Admit: 2020-10-02 | Discharge: 2020-10-04 | Disposition: A | Discharge status: Home / Self Care | Payer: No Typology Code available for payment source | Attending: Emergency Medicine | Admitting: Emergency Medicine

## 2020-10-02 ENCOUNTER — Encounter (HOSPITAL_COMMUNITY): Payer: Self-pay | Admitting: Emergency Medicine

## 2020-10-02 ENCOUNTER — Other Ambulatory Visit: Payer: Self-pay

## 2020-10-02 DIAGNOSIS — Z20822 Contact with and (suspected) exposure to covid-19: Secondary | ICD-10-CM | POA: Diagnosis not present

## 2020-10-02 DIAGNOSIS — K625 Hemorrhage of anus and rectum: Principal | ICD-10-CM | POA: Insufficient documentation

## 2020-10-02 DIAGNOSIS — F17210 Nicotine dependence, cigarettes, uncomplicated: Secondary | ICD-10-CM | POA: Insufficient documentation

## 2020-10-02 DIAGNOSIS — K5730 Diverticulosis of large intestine without perforation or abscess without bleeding: Secondary | ICD-10-CM | POA: Insufficient documentation

## 2020-10-02 LAB — COMPREHENSIVE METABOLIC PANEL
ALT: 21 U/L (ref 0–44)
AST: 18 U/L (ref 15–41)
Albumin: 4 g/dL (ref 3.5–5.0)
Alkaline Phosphatase: 90 U/L (ref 38–126)
Anion gap: 8 (ref 5–15)
BUN: 17 mg/dL (ref 6–20)
CO2: 27 mmol/L (ref 22–32)
Calcium: 9.1 mg/dL (ref 8.9–10.3)
Chloride: 104 mmol/L (ref 98–111)
Creatinine, Ser: 0.69 mg/dL (ref 0.44–1.00)
GFR, Estimated: >60 mL/min (ref >60)
Glucose, Bld: 108 mg/dL — ABNORMAL HIGH (ref 70–99)
Potassium: 3.6 mmol/L (ref 3.5–5.1)
Sodium: 139 mmol/L (ref 135–145)
Total Bilirubin: 0.6 mg/dL (ref 0.3–1.2)
Total Protein: 7 g/dL (ref 6.5–8.1)

## 2020-10-02 LAB — CBC
HCT: 42 % (ref 36.0–46.0)
Hemoglobin: 13.6 g/dL (ref 12.0–15.0)
MCH: 30 pg (ref 26.0–34.0)
MCHC: 32.4 g/dL (ref 30.0–36.0)
MCV: 92.5 fL (ref 80.0–100.0)
Platelets: 263 10*3/uL (ref 150–400)
RBC: 4.54 MIL/uL (ref 3.87–5.11)
RDW: 13.6 % (ref 11.5–15.5)
WBC: 5 10*3/uL (ref 4.0–10.5)
nRBC: 0 % (ref 0.0–0.2)

## 2020-10-02 LAB — I-STAT BETA HCG BLOOD, ED (MC, WL, AP ONLY): I-stat hCG, quantitative: 5.8 m[IU]/mL — ABNORMAL HIGH (ref <5)

## 2020-10-02 NOTE — ED Triage Notes (Signed)
Patient is complaining of blood in her stool that started an hour ago. Patient is not complaining of any other symptoms.

## 2020-10-03 ENCOUNTER — Encounter (HOSPITAL_COMMUNITY): Payer: Self-pay | Admitting: Internal Medicine

## 2020-10-03 ENCOUNTER — Encounter (HOSPITAL_COMMUNITY)
Admission: EM | Disposition: A | Discharge status: Home / Self Care | Payer: Self-pay | Source: Home / Self Care | Attending: Emergency Medicine

## 2020-10-03 ENCOUNTER — Observation Stay (HOSPITAL_COMMUNITY): Payer: No Typology Code available for payment source | Admitting: Anesthesiology

## 2020-10-03 DIAGNOSIS — K625 Hemorrhage of anus and rectum: Secondary | ICD-10-CM | POA: Diagnosis not present

## 2020-10-03 HISTORY — PX: COLONOSCOPY: SHX5424

## 2020-10-03 LAB — BASIC METABOLIC PANEL
Anion gap: 8 (ref 5–15)
BUN: 19 mg/dL (ref 6–20)
CO2: 27 mmol/L (ref 22–32)
Calcium: 9 mg/dL (ref 8.9–10.3)
Chloride: 105 mmol/L (ref 98–111)
Creatinine, Ser: 0.54 mg/dL (ref 0.44–1.00)
GFR, Estimated: >60 mL/min (ref >60)
Glucose, Bld: 93 mg/dL (ref 70–99)
Potassium: 3.8 mmol/L (ref 3.5–5.1)
Sodium: 140 mmol/L (ref 135–145)

## 2020-10-03 LAB — CBC
HCT: 40.1 % (ref 36.0–46.0)
Hemoglobin: 12.8 g/dL (ref 12.0–15.0)
MCH: 29.6 pg (ref 26.0–34.0)
MCHC: 31.9 g/dL (ref 30.0–36.0)
MCV: 92.8 fL (ref 80.0–100.0)
Platelets: 244 10*3/uL (ref 150–400)
RBC: 4.32 MIL/uL (ref 3.87–5.11)
RDW: 13.5 % (ref 11.5–15.5)
WBC: 4.7 10*3/uL (ref 4.0–10.5)
nRBC: 0 % (ref 0.0–0.2)

## 2020-10-03 LAB — RESP PANEL BY RT PCR (RSV, FLU A&B, COVID)
Influenza A by PCR: NEGATIVE
Influenza B by PCR: NEGATIVE
Respiratory Syncytial Virus by PCR: NEGATIVE
SARS Coronavirus 2 by RT PCR: NEGATIVE

## 2020-10-03 LAB — TYPE AND SCREEN
ABO/RH(D): O POS
Antibody Screen: NEGATIVE

## 2020-10-03 LAB — ABO/RH: ABO/RH(D): O POS

## 2020-10-03 LAB — SARS CORONAVIRUS 2 (TAT 6-24 HRS): SARS Coronavirus 2: NEGATIVE

## 2020-10-03 LAB — HIV ANTIBODY (ROUTINE TESTING W REFLEX): HIV Screen 4th Generation wRfx: NONREACTIVE

## 2020-10-03 SURGERY — COLONOSCOPY
Anesthesia: Monitor Anesthesia Care | Laterality: Left

## 2020-10-03 MED ORDER — SODIUM CHLORIDE 0.9 % IV SOLN: INTRAVENOUS | Status: DC

## 2020-10-03 MED ORDER — ONDANSETRON HCL 4 MG PO TABS: 4.0000 mg | ORAL_TABLET | Freq: Four times a day (QID) | ORAL | Status: DC | PRN

## 2020-10-03 MED ORDER — PEG 3350-KCL-NA BICARB-NACL 420 G PO SOLR
4000.0000 mL | Freq: Once | ORAL | Status: AC
  Administered 2020-10-03: 4000 mL via ORAL
  Filled 2020-10-03: qty 4000

## 2020-10-03 MED ORDER — LACTATED RINGERS IV SOLN: INTRAVENOUS | Status: DC | PRN

## 2020-10-03 MED ORDER — ACETAMINOPHEN 650 MG RE SUPP: 650.0000 mg | Freq: Four times a day (QID) | RECTAL | Status: DC | PRN

## 2020-10-03 MED ORDER — LIDOCAINE 2% (20 MG/ML) 5 ML SYRINGE
INTRAMUSCULAR | Status: DC | PRN
  Administered 2020-10-03: 50 mg via INTRAVENOUS

## 2020-10-03 MED ORDER — ONDANSETRON HCL 4 MG/2ML IJ SOLN: 4.0000 mg | Freq: Four times a day (QID) | INTRAMUSCULAR | Status: DC | PRN

## 2020-10-03 MED ORDER — PROPOFOL 500 MG/50ML IV EMUL
INTRAVENOUS | Status: DC | PRN
  Administered 2020-10-03: 125 ug/kg/min via INTRAVENOUS

## 2020-10-03 MED ORDER — ACETAMINOPHEN 325 MG PO TABS: 650.0000 mg | ORAL_TABLET | Freq: Four times a day (QID) | ORAL | Status: DC | PRN

## 2020-10-03 MED ORDER — PROPOFOL 10 MG/ML IV BOLUS
INTRAVENOUS | Status: DC | PRN
  Administered 2020-10-03: 20 mg via INTRAVENOUS

## 2020-10-03 NOTE — Transfer of Care (Signed)
Immediate Anesthesia Transfer of Care Note  Patient: Stacey Fuller  Procedure(s) Performed: COLONOSCOPY (Left )  Patient Location: Endoscopy Unit  Anesthesia Type:MAC  Level of Consciousness: awake, alert , oriented and patient cooperative  Airway & Oxygen Therapy: Patient Spontanous Breathing and Patient connected to face mask oxygen  Post-op Assessment: Report given to RN, Post -op Vital signs reviewed and stable and Patient moving all extremities  Post vital signs: Reviewed and stable  Last Vitals:  Vitals Value Taken Time  BP    Temp    Pulse 71 10/03/20 1403  Resp 19 10/03/20 1403  SpO2 99 % 10/03/20 1403  Vitals shown include unvalidated device data.  Last Pain:  Vitals:   10/03/20 1402  TempSrc: (P) Oral  PainSc:          Complications: No complications documented.

## 2020-10-03 NOTE — Anesthesia Postprocedure Evaluation (Signed)
Anesthesia Post Note  Patient: Stacey Fuller  Procedure(s) Performed: COLONOSCOPY (Left )     Patient location during evaluation: Endoscopy Anesthesia Type: MAC Level of consciousness: awake and alert, patient cooperative and oriented Pain management: pain level controlled Vital Signs Assessment: post-procedure vital signs reviewed and stable Respiratory status: spontaneous breathing, nonlabored ventilation and respiratory function stable Cardiovascular status: blood pressure returned to baseline and stable Postop Assessment: no apparent nausea or vomiting Anesthetic complications: no   No complications documented.  Last Vitals:  Vitals:   10/03/20 1248 10/03/20 1402  BP: (!) 168/93 (!) 102/58  Pulse: 63 (!) 58  Resp: 20 14  Temp: 36.7 C 36.6 C  SpO2: 97% 100%    Last Pain:  Vitals:   10/03/20 1402  TempSrc: Oral  PainSc: 0-No pain                 Lanae Federer,E. Sonnia Strong

## 2020-10-03 NOTE — Op Note (Signed)
[facility] Patient Name: Stacey Fuller Procedure Date: 10/03/2020 MRN: [id number] Attending MD: Jeani Hawking , MD Date of Birth: Jun 04, 1961 CSN: [id number] Age: 59 Admit Type: Outpatient Procedure:                Colonoscopy Indications:              Hematochezia Providers:                Jeani Hawking, MD, Alison Murray, RN, Fayrene Fearing,                            RN, Brion Aliment, Technician Referring MD:              Medicines:                 Complications:            No immediate complications. Estimated Blood Loss:     Estimated blood loss: none. Procedure:                Pre-Anesthesia Assessment:                           - Prior to the procedure, a History and Physical                            was performed, and patient medications and                            allergies were reviewed. The patient's tolerance of                            previous anesthesia was also reviewed. The risks                            and benefits of the procedure and the sedation                            options and risks were discussed with the patient.                            All questions were answered, and informed consent                            was obtained. Prior Anticoagulants: The patient has                            taken no previous anticoagulant or antiplatelet                            agents. ASA Grade Assessment: III - A patient with                            severe systemic disease. After reviewing the risks                            and benefits, the  patient was deemed in                            satisfactory condition to undergo the procedure.                           - Sedation was administered by an anesthesia                            professional. Deep sedation was attained.                           After obtaining informed consent, the colonoscope                            was passed under direct vision. Throughout the                             procedure, the patient's blood pressure, pulse, and                            oxygen saturations were monitored continuously. The                            CF-HQ190L ([id number] Olympus colonoscope was                            introduced through the anus and advanced to the the                            cecum, identified by appendiceal orifice and                            ileocecal valve. The colonoscopy was performed                            without difficulty. The patient tolerated the                            procedure well. The quality of the bowel                            preparation was good. The ileocecal valve,                            appendiceal orifice, and rectum were photographed. Scope In: 1:40:23 PM Scope Out: 1:52:29 PM Scope Withdrawal Time: 0 hours 8 minutes 49 seconds  Total Procedure Duration: 0 hours 12 minutes 6 seconds  Findings:      Scattered small and large-mouthed diverticula were found in the entire       colon. The majority of the diverticula were found in the left side of       the colon. In this part of the colon there was evidence of blood and       blood in the stool balls. Proximal to this  point the stool was normal in       color and there was no evidence of any blood. Impression:               - Diverticulosis in the entire examined colon. The                            current findings are consistent with a diverticular                            bleed.                           - No specimens collected. Moderate Sedation:      Not Applicable - Patient had care per Anesthesia. Recommendation:           - Return patient to hospital ward for ongoing care.                           - Resume regular diet.                           - Continue present medications.                           - Repeat colonoscopy in 10 years for screening                            purposes.                           - If no further bleeding by tomorrow  AM, she can be                            discharged home.                           - Follow up with Dr. Loreta Ave in the office in 2-4                            weeks. Procedure Code(s):        --- Professional ---                           [phone], Colonoscopy, flexible; diagnostic, including                            collection of specimen(s) by brushing or washing,                            when performed (separate procedure) Diagnosis Code(s):        --- Professional ---                           K92.1, Melena (includes Hematochezia)  K57.30, Diverticulosis of large intestine without                            perforation or abscess without bleeding CPT copyright 2019 American Medical Association. All rights reserved. The codes documented in this report are preliminary and upon coder review may  be revised to meet current compliance requirements. Jeani Hawking, MD Jeani Hawking, MD 10/03/2020 2:01:54 PM This report has been signed electronically. Number of Addenda: 0

## 2020-10-03 NOTE — H&P (Signed)
History and Physical    Stacey Fuller ACZ:[id number] DOB: [birth year] DOA: 10/02/2020  PCP: Patient, No Pcp Per  Patient coming from: Home  I have personally briefly reviewed patient's old medical records in [facility]  Chief Complaint: BRBPR  HPI: Stacey Fuller is a 59 y.o. female with medical history significant of Colon polyps.  Pt presents to the ED with c/o 2 bloody BMs this evening.  Had normal in the morning previously.  Quality is bright red blood.  Did have some diverticulosis on last colonoscopy (2014).  Never had GIB before.  Denies abd pain, N/V, dizziness or lightheadedness.  Last bloody BM was about 1h PTA.   ED Course: HGB 13.  EDP spoke with Dr. Loreta Ave, hospitalist asked to admit.   Review of Systems: As per HPI, otherwise all review of systems negative.  Past Medical History:  Diagnosis Date  . Allergy     Past Surgical History:  Procedure Laterality Date  . ABDOMINAL HYSTERECTOMY    . CESAREAN SECTION       reports that she has been smoking. She has a 25.00 pack-year smoking history. She has never used smokeless tobacco. She reports current alcohol use of about 1.0 - 2.0 standard drink of alcohol per week. She reports previous drug use.  No Known Allergies  Family History  Adopted: Yes  Problem Relation Age of Onset  . Cancer Sister        late 65s, breast cancer  . Cancer Sister        early 58s, breast cancer     Prior to Admission medications   Medication Sig Start Date End Date Taking? Authorizing Provider  OVER THE COUNTER MEDICATION Take 1 tablet by mouth daily.    Yes [provider]    Physical Exam: Vitals:   10/02/20 2251 10/02/20 2311  BP: (!) 149/97   Pulse: 89   Resp: 18   Temp: 98.6 F (37 C)   TempSrc: Oral   SpO2: 98%   Weight:  95.7 kg  Height:  5\' 1"  (1.549 m)    Constitutional: NAD, calm, comfortable Eyes: PERRL, lids and conjunctivae normal ENMT: Mucous membranes are moist. Posterior pharynx clear of  any exudate or lesions.Normal dentition.  Neck: normal, supple, no masses, no thyromegaly Respiratory: clear to auscultation bilaterally, no wheezing, no crackles. Normal respiratory effort. No accessory muscle use.  Cardiovascular: Regular rate and rhythm, no murmurs / rubs / gallops. No extremity edema. 2+ pedal pulses. No carotid bruits.  Abdomen: no tenderness, no masses palpated. No hepatosplenomegaly. Bowel sounds positive.  Musculoskeletal: no clubbing / cyanosis. No joint deformity upper and lower extremities. Good ROM, no contractures. Normal muscle tone.  Skin: no rashes, lesions, ulcers. No induration Neurologic: CN 2-12 grossly intact. Sensation intact, DTR normal. Strength 5/5 in all 4.  Psychiatric: Normal judgment and insight. Alert and oriented x 3. Normal mood.    Labs on Admission: I have personally reviewed following labs and imaging studies  CBC: Recent Labs  Lab 10/02/20 2306  WBC 5.0  HGB 13.6  HCT 42.0  MCV 92.5  PLT 263   Basic Metabolic Panel: Recent Labs  Lab 10/02/20 2306  NA 139  K 3.6  CL 104  CO2 27  GLUCOSE 108*  BUN 17  CREATININE 0.69  CALCIUM 9.1   GFR: Estimated Creatinine Clearance: 81.1 mL/min (by C-G formula based on SCr of 0.69 mg/dL). Liver Function Tests: Recent Labs  Lab 10/02/20 2306  AST 18  ALT 21  ALKPHOS 90  BILITOT 0.6  PROT 7.0  ALBUMIN 4.0   No results for input(s): LIPASE, AMYLASE in the last 168 hours. No results for input(s): AMMONIA in the last 168 hours. Coagulation Profile: No results for input(s): INR, PROTIME in the last 168 hours. Cardiac Enzymes: No results for input(s): CKTOTAL, CKMB, CKMBINDEX, TROPONINI in the last 168 hours. BNP (last 3 results) No results for input(s): PROBNP in the last 8760 hours. HbA1C: No results for input(s): HGBA1C in the last 72 hours. CBG: No results for input(s): GLUCAP in the last 168 hours. Lipid Profile: No results for input(s): CHOL, HDL, LDLCALC, TRIG, CHOLHDL,  LDLDIRECT in the last 72 hours. Thyroid Function Tests: No results for input(s): TSH, T4TOTAL, FREET4, T3FREE, THYROIDAB in the last 72 hours. Anemia Panel: No results for input(s): VITAMINB12, FOLATE, FERRITIN, TIBC, IRON, RETICCTPCT in the last 72 hours. Urine analysis:    Component Value Date/Time   COLORURINE YELLOW 05/29/2020 0824   APPEARANCEUR CLEAR 05/29/2020 0824   LABSPEC 1.020 05/29/2020 0824   PHURINE 5.0 05/29/2020 0824   GLUCOSEU NEGATIVE 05/29/2020 0824   HGBUR SMALL (A) 05/29/2020 0824   BILIRUBINUR NEGATIVE 05/29/2020 0824   KETONESUR NEGATIVE 05/29/2020 0824   PROTEINUR NEGATIVE 05/29/2020 0824   UROBILINOGEN 0.2 05/25/2018 1211   NITRITE NEGATIVE 05/29/2020 0824   LEUKOCYTESUR NEGATIVE 05/29/2020 0824    Radiological Exams on Admission: No results found.  EKG: Independently reviewed.  Assessment/Plan Active Problems:   BRBPR (bright red blood per rectum)    1. BRBPR - 1. Per Dr. Loreta Ave: 1. Clear liquid diet 2. Will see in AM 2. Last episode 1h PTA 3. If any further bleeding consider CTA to localize for possible IR embolization. 4. Repeat CBC/BMP in AM 5. Tele monitor  DVT prophylaxis: SCDs Code Status: Full Family Communication: No family in room Disposition Plan: Home after HGB stable and GIB stopped Consults called: Dr. Loreta Ave called by EDP Admission status: Place in obs   Tyreak Reagle M. DO [facility]  How to contact the [facility] Attending or Consulting provider 7A - 7P or covering provider during after hours 7P -7A, for this patient?  1. Check the care team in [facility] and look for a) attending/consulting [facility] provider listed and b) the [facility] team listed 2. Log into [email or web address]  Amion Physician Scheduling and messaging for groups and whole hospitals  On call and physician scheduling software for group practices, residents, hospitalists and other medical providers for call, clinic, rotation and shift schedules. OnCall [employer] is a  hospital-wide system for scheduling doctors and paging doctors on call. EasyPlot is for scientific plotting and data analysis.  [email or web address]  and use [facility]'s universal password to access. If you do not have the password, please contact the hospital operator.  3. Locate the [facility] provider you are looking for under [facility] and page to a number that you can be directly reached. 4. If you still have difficulty reaching the provider, please page the [facility] (Director on Call) for the Hospitalists listed on amion for assistance.  10/03/2020, 2:51 AM

## 2020-10-03 NOTE — Anesthesia Preprocedure Evaluation (Addendum)
Anesthesia Evaluation  Patient identified by MRN, date of birth, ID band Patient awake    Reviewed: Allergy & Precautions, NPO status , Patient's Chart, lab work & pertinent test results  History of Anesthesia Complications Negative for: history of anesthetic complications  Airway Mallampati: II  TM Distance: >3 FB Neck ROM: Full    Dental  (+) Dental Advisory Given, Teeth Intact   Pulmonary Current Smoker and Patient abstained from smoking.,  10/03/2020 SARS coronavirus NEG   breath sounds clear to auscultation       Cardiovascular negative cardio ROS   Rhythm:Regular Rate:Normal     Neuro/Psych negative neurological ROS     GI/Hepatic Neg liver ROS, melena   Endo/Other  Morbid obesity  Renal/GU negative Renal ROS     Musculoskeletal   Abdominal (+) + obese,   Peds  Hematology negative hematology ROS (+)   Anesthesia Other Findings   Reproductive/Obstetrics                            Anesthesia Physical Anesthesia Plan  ASA: II  Anesthesia Plan: MAC   Post-op Pain Management:    Induction:   PONV Risk Score and Plan: 1 and Treatment may vary due to age or medical condition  Airway Management Planned: Natural Airway and Simple Face Mask  Additional Equipment: None  Intra-op Plan:   Post-operative Plan:   Informed Consent: I have reviewed the patients History and Physical, chart, labs and discussed the procedure including the risks, benefits and alternatives for the proposed anesthesia with the patient or authorized representative who has indicated his/her understanding and acceptance.     Dental advisory given  Plan Discussed with: CRNA and Surgeon  Anesthesia Plan Comments:        Anesthesia Quick Evaluation

## 2020-10-03 NOTE — Progress Notes (Signed)
Patient is a 59 year old female with history of chronic polyps, diverticulosis who presented to the emergency room with complaints of bright red blood per rectum.  2 episodes at home.  On presentation, she was hemodynamically stable.  Hemoglobin was stable. No bloody bowel movement after admission.  She had history of diverticulosis as per last colonoscopy. GI consulted and she underwent colonoscopy this morning which showed diverticulosis.  Diverticular bleeding suspected. Patient seen and examined at the bedside this morning.  During my evaluation, she is comfortable.  No report of abdominal pain. We will plan to discharge her tomorrow to home if her hemoglobin stays stable. Her physical examination was unremarkable.

## 2020-10-03 NOTE — Interval H&P Note (Signed)
History and Physical Interval Note:  10/03/2020 1:32 PM  Stacey Fuller  has presented today for surgery, with the diagnosis of Rectal bleeding.  The various methods of treatment have been discussed with the patient and family. After consideration of risks, benefits and other options for treatment, the patient has consented to  Procedure(s): COLONOSCOPY (Left) as a surgical intervention.  The patient's history has been reviewed, patient examined, no change in status, stable for surgery.  I have reviewed the patient's chart and labs.  Questions were answered to the patient's satisfaction.     Karisa Nesser D

## 2020-10-03 NOTE — ED Provider Notes (Signed)
[facility]-EMERGENCY DEPT Provider Note   CSN: [id number] Arrival date & time: 10/02/20  2229   Time seen 12:45 AM  History Chief Complaint  Patient presents with  . Rectal Bleeding    Stacey Fuller is a 59 y.o. female.  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  She states she had a normal bowel movement earlier this morning.  This evening after eating pizza she felt the need to use the bathroom and when she did that was stool and lots of red blood.  She had 2 episodes of that and she has pictures of it.  She denies abdominal pain, nausea, vomiting, feeling dizzy or lightheaded or having gastric reflux symptoms.  She states she had the rectal bleeding about an hour prior to arrival in the ED.  She states she is never had this before.  She has had colonoscopy and had polyps removed before.  PCP Patient, No Pcp Per Gastroenterology Dr. Loreta Ave   Past Medical History:  Diagnosis Date  . Allergy     Patient Active Problem List   Diagnosis Date Noted  . BRBPR (bright red blood per rectum) 10/03/2020    Past Surgical History:  Procedure Laterality Date  . ABDOMINAL HYSTERECTOMY    . CESAREAN SECTION       OB History   No obstetric history on file.     Family History  Adopted: Yes  Problem Relation Age of Onset  . Cancer Sister        late 59s, breast cancer  . Cancer Sister        early 2s, breast cancer    Social History   Tobacco Use  . Smoking status: Current Every Day Smoker    Packs/day: 1.00    Years: 25.00    Pack years: 25.00  . Smokeless tobacco: Never Used  . Tobacco comment: 25 PLUS years  Vaping Use  . Vaping Use: Never used  Substance Use Topics  . Alcohol use: Yes    Alcohol/week: 1.0 - 2.0 standard drink    Types: 1 - 2 Standard drinks or equivalent per week  . Drug use: Not Currently    Comment: marijuana   Employed at [employer] Medications Prior to Admission medications   Medication Sig Start Date End Date Taking? Authorizing Provider  OVER THE COUNTER MEDICATION Take 1 tablet by mouth daily.    Yes [provider]  Patient states she only takes the magnesium and potassium supplement  Allergies    Patient has no known allergies.  Review of Systems   Review of Systems  All other systems reviewed and are negative.   Physical Exam Updated Vital Signs BP (!) 149/97 (BP Location: Left Arm)   Pulse 89   Temp 98.6 F (37 C) (Oral)   Resp 18   Ht 5\' 1"  (1.549 m)   Wt 95.7 kg   SpO2 98%   BMI 39.87 kg/m   Physical Exam Vitals and nursing note reviewed.  Constitutional:      General: She is not in acute distress.    Appearance: Normal appearance. She is obese.  HENT:     Head: Normocephalic and atraumatic.     Right Ear: External ear normal.     Left Ear: External ear normal.  Eyes:     Extraocular Movements: Extraocular movements intact.  Conjunctiva/sclera: Conjunctivae normal.     Pupils: Pupils are equal, round, and reactive to light.  Cardiovascular:     Rate and Rhythm: Normal rate and regular rhythm.     Pulses: Normal pulses.     Heart sounds: Normal heart sounds. No murmur heard.   Pulmonary:     Effort: Pulmonary effort is normal. No respiratory distress.     Breath sounds: Normal breath sounds.  Abdominal:     General: Abdomen is flat. Bowel sounds are increased.     Tenderness: There is no abdominal tenderness. There is no guarding or rebound.  Musculoskeletal:        General: Normal range of motion.     Cervical back: Normal range of motion.  Skin:    General: Skin is warm and dry.  Neurological:     General: No focal deficit present.     Mental Status: She is alert and oriented to person, place, and time.     Cranial Nerves: No cranial nerve deficit.  Psychiatric:        Mood and Affect: Mood normal.        Behavior: Behavior normal.         Thought Content: Thought content normal.    Orthostatic VS for the past 24 hrs:  BP- Lying Pulse- Lying BP- Sitting Pulse- Sitting BP- Standing at 0 minutes Pulse- Standing at 0 minutes  10/03/20 0023 141/90 74 148/89 77 136/88 82   Orthostatics are within normal limits   ED Results / Procedures / Treatments   Labs (all labs ordered are listed, but only abnormal results are displayed) Results for orders placed or performed during the hospital encounter of 10/02/20  Comprehensive metabolic panel  Result Value Ref Range   Sodium 139 135 - 145 mmol/L   Potassium 3.6 3.5 - 5.1 mmol/L   Chloride 104 98 - 111 mmol/L   CO2 27 22 - 32 mmol/L   Glucose, Bld 108 (H) 70 - 99 mg/dL   BUN 17 6 - 20 mg/dL   Creatinine, Ser 0.09 0.44 - 1.00 mg/dL   Calcium 9.1 8.9 - 38.1 mg/dL   Total Protein 7.0 6.5 - 8.1 g/dL   Albumin 4.0 3.5 - 5.0 g/dL   AST 18 15 - 41 U/L   ALT 21 0 - 44 U/L   Alkaline Phosphatase 90 38 - 126 U/L   Total Bilirubin 0.6 0.3 - 1.2 mg/dL   GFR, Estimated >82 >99 mL/min   Anion gap 8 5 - 15  CBC  Result Value Ref Range   WBC 5.0 4.0 - 10.5 K/uL   RBC 4.54 3.87 - 5.11 MIL/uL   Hemoglobin 13.6 12.0 - 15.0 g/dL   HCT 37.1 36 - 46 %   MCV 92.5 80.0 - 100.0 fL   MCH 30.0 26.0 - 34.0 pg   MCHC 32.4 30.0 - 36.0 g/dL   RDW 69.6 78.9 - 38.1 %   Platelets 263 150 - 400 K/uL   nRBC 0.0 0.0 - 0.2 %  I-Stat beta hCG blood, ED  Result Value Ref Range   I-stat hCG, quantitative 5.8 (H) <5 mIU/mL   Comment 3          Type and screen [facility]  Result Value Ref Range   ABO/RH(D) O POS    Antibody Screen NEG    Sample Expiration      10/05/2020,2359 Performed at [facility], [address], [place], [place] [address]  Laboratory interpretation all normal except nonfasting hyperglycemia    EKG None  Radiology No results found.  Procedures Procedures (including critical care time)  Medications Ordered in ED Medications     ED Course  I have reviewed the triage vital signs and the nursing notes.  Pertinent labs & imaging results that were available during my care of the patient were reviewed by me and considered in my medical decision making (see chart for details).    MDM Rules/Calculators/A&P                          There is a colonoscopy report from June 21, 2013 showing patient had 7 diminutive polyps removed from the rectosigmoid colon, a 7 mm sessile polyp was removed from her mid transverse colon and she was noted to have a few small and large mouth diverticula in the sigmoid colon.  Patient's laboratory tests are normal.  Orthostatic vital signs were ordered. We discussed she probably needs to stay in the hospital and get a colonoscopy done.  I am not sure if she needs to get a CT scan she has absolutely no abdominal pain by history or on exam.  I will talk to the gastroenterologist on-call to see if they want the CT to be done.  2:17 AM Dr. Loreta Ave, gastroenterologist states to keep on clear liquid diet and they will see in the morning.  Since she is not having abdominal pain she did not feel abdominal/pelvis CT indicated this morning.  02:38 Dr Julian Reil, hospitalist, will admit  Final Clinical Impression(s) / ED Diagnoses Final diagnoses:  Rectal bleeding    Rx / DC Orders  Plan admit  Devoria Albe, MD, Concha Pyo, MD 10/03/20 [id number]

## 2020-10-04 ENCOUNTER — Encounter (HOSPITAL_COMMUNITY): Payer: Self-pay | Admitting: Gastroenterology

## 2020-10-04 DIAGNOSIS — K625 Hemorrhage of anus and rectum: Secondary | ICD-10-CM | POA: Diagnosis not present

## 2020-10-04 LAB — CBC WITH DIFFERENTIAL/PLATELET
Abs Immature Granulocytes: 0 10*3/uL (ref 0.00–0.07)
Basophils Absolute: 0 10*3/uL (ref 0.0–0.1)
Basophils Relative: 1 %
Eosinophils Absolute: 0.1 10*3/uL (ref 0.0–0.5)
Eosinophils Relative: 3 %
HCT: 40.5 % (ref 36.0–46.0)
Hemoglobin: 13 g/dL (ref 12.0–15.0)
Immature Granulocytes: 0 %
Lymphocytes Relative: 30 %
Lymphs Abs: 1.2 10*3/uL (ref 0.7–4.0)
MCH: 29.5 pg (ref 26.0–34.0)
MCHC: 32.1 g/dL (ref 30.0–36.0)
MCV: 92 fL (ref 80.0–100.0)
Monocytes Absolute: 0.3 10*3/uL (ref 0.1–1.0)
Monocytes Relative: 8 %
Neutro Abs: 2.4 10*3/uL (ref 1.7–7.7)
Neutrophils Relative %: 58 %
Platelets: 229 10*3/uL (ref 150–400)
RBC: 4.4 MIL/uL (ref 3.87–5.11)
RDW: 13.4 % (ref 11.5–15.5)
WBC: 4 10*3/uL (ref 4.0–10.5)
nRBC: 0 % (ref 0.0–0.2)

## 2020-10-04 MED ORDER — POLYETHYLENE GLYCOL 3350 17 G PO PACK: 17.0000 g | PACK | Freq: Every day | ORAL | 1 refills | Status: DC | PRN

## 2020-10-04 NOTE — Plan of Care (Signed)

## 2020-10-04 NOTE — Discharge Summary (Signed)
Physician Discharge Summary  Stacey Fuller ZOX:[id number] DOB: 04-06-1961 DOA: 10/02/2020  PCP: Patient, No Pcp Per  Admit date: 10/02/2020 Discharge date: 10/04/2020  Admitted From: Home Disposition:  Home  Discharge Condition:Stable CODE STATUS:FULL Diet recommendation: Heart Healthy    Brief/Interim Summary:  Patient is a 59 year old female with history of chronic polyps, diverticulosis who presented to the emergency room with complaints of bright red blood per rectum. 2 episodes at home.  On presentation, she was hemodynamically stable. Hemoglobin was stable. No bloody bowel movement after admission.  She had history of diverticulosis as per last colonoscopy. GI consulted and she underwent colonoscopy  which showed diverticulosis, no active bleeding.  Diverticular bleeding suspected. Patient seen and examined at the bedside this morning.    She does not have any further episodes of lower GI bleed. Her hemoglobin is stable today.  Stable for discharge.  Discharge Diagnoses:  Active Problems:   BRBPR (bright red blood per rectum)    Discharge Instructions  Discharge Instructions    Diet general   Complete by: As directed    Discharge instructions   Complete by: As directed    1)Please follow up with your PCP and gastroenterologist as an outpatient . 2)Take prescribed medication as instructed.Prevent constipation by having regular exercise,plenty of fluids and high fiber diet   Increase activity slowly   Complete by: As directed      Allergies as of 10/04/2020   No Known Allergies     Medication List    TAKE these medications   OVER THE COUNTER MEDICATION Take 1 tablet by mouth daily.   polyethylene glycol 17 g packet Commonly known as: MiraLax Take 17 g by mouth daily as needed.       No Known Allergies  Consultations: GI  Procedures/Studies:  No results found.    Subjective: Patient seen and examined at the bedside this morning.  Hemodynamically  stable for discharge today.  Discharge Exam: Vitals:   10/04/20 0412 10/04/20 0422  BP: (!) 177/86 137/83  Pulse: 61 65  Resp: 16   Temp: 98.3 F (36.8 C)   SpO2: 97%    Vitals:   10/03/20 1840 10/03/20 2016 10/04/20 0412 10/04/20 0422  BP: 118/72 139/82 (!) 177/86 137/83  Pulse: 68 76 61 65  Resp: Temp: 98.2 F (36.8 C) 98 F (36.7 C) 98.3 F (36.8 C)   TempSrc: Oral Oral Oral   SpO2: 100% 98% 97%   Weight:      Height:        General: Pt is alert, awake, not in acute distress Cardiovascular: RRR, S1/S2 +, no rubs, no gallops Respiratory: CTA bilaterally, no wheezing, no rhonchi Abdominal: Soft, NT, ND, bowel sounds + Extremities: no edema, no cyanosis    The results of significant diagnostics from this hospitalization (including imaging, microbiology, ancillary and laboratory) are listed below for reference.     Microbiology: Recent Results (from the past 240 hour(s))  Resp Panel by RT PCR (RSV, Flu A&B, Covid) - Nasopharyngeal Swab     Status: None   Collection Time: 10/03/20  2:43 AM   Specimen: Nasopharyngeal Swab  Result Value Ref Range Status   SARS Coronavirus 2 by RT PCR NEGATIVE NEGATIVE Final    Comment: (NOTE) SARS-CoV-2 target nucleic acids are NOT DETECTED.  The SARS-CoV-2 RNA is generally detectable in upper respiratoy specimens during the acute phase of infection. The lowest concentration of SARS-CoV-2 viral copies this assay can detect is  131 copies/mL. A negative result does not preclude SARS-Cov-2 infection and should not be used as the sole basis for treatment or other patient management decisions. A negative result may occur with  improper specimen collection/handling, submission of specimen other than nasopharyngeal swab, presence of viral mutation(s) within the areas targeted by this assay, and inadequate number of viral copies (<131 copies/mL). A negative result must be combined with clinical observations, patient history,  and epidemiological information. The expected result is Negative.  Fact Sheet for Patients:  https://www.moore.com/  Fact Sheet for Healthcare Providers:  https://www.young.biz/  This test is no t yet approved or cleared by the [place] FDA and  has been authorized for detection and/or diagnosis of SARS-CoV-2 by FDA under an Emergency Use Authorization (EUA). This EUA will remain  in effect (meaning this test can be used) for the duration of the COVID-19 declaration under Section 564(b)(1) of the Act, 21 U.S.C. section 360bbb-3(b)(1), unless the authorization is terminated or revoked sooner.     Influenza A by PCR NEGATIVE NEGATIVE Final   Influenza B by PCR NEGATIVE NEGATIVE Final    Comment: (NOTE) The Xpert Xpress SARS-CoV-2/FLU/RSV assay is intended as an aid in  the diagnosis of influenza from Nasopharyngeal swab specimens and  should not be used as a sole basis for treatment. Nasal washings and  aspirates are unacceptable for Xpert Xpress SARS-CoV-2/FLU/RSV  testing.  Fact Sheet for Patients: https://www.moore.com/  Fact Sheet for Healthcare Providers: https://www.young.biz/  This test is not yet approved or cleared by the [place] FDA and  has been authorized for detection and/or diagnosis of SARS-CoV-2 by  FDA under an Emergency Use Authorization (EUA). This EUA will remain  in effect (meaning this test can be used) for the duration of the  Covid-19 declaration under Section 564(b)(1) of the Act, 21  U.S.C. section 360bbb-3(b)(1), unless the authorization is  terminated or revoked.    Respiratory Syncytial Virus by PCR NEGATIVE NEGATIVE Final    Comment: (NOTE) Fact Sheet for Patients: https://www.moore.com/  Fact Sheet for Healthcare Providers: https://www.young.biz/  This test is not yet approved or cleared by the [place] FDA and   has been authorized for detection and/or diagnosis of SARS-CoV-2 by  FDA under an Emergency Use Authorization (EUA). This EUA will remain  in effect (meaning this test can be used) for the duration of the  COVID-19 declaration under Section 564(b)(1) of the Act, 21 U.S.C.  section 360bbb-3(b)(1), unless the authorization is terminated or  revoked. Performed at [facility], [address], [place], [place] [address]   SARS CORONAVIRUS 2 (TAT 6-24 HRS) Nasopharyngeal Nasopharyngeal Swab     Status: None   Collection Time: 10/03/20  8:51 AM   Specimen: Nasopharyngeal Swab  Result Value Ref Range Status   SARS Coronavirus 2 NEGATIVE NEGATIVE Final    Comment: (NOTE) SARS-CoV-2 target nucleic acids are NOT DETECTED.  The SARS-CoV-2 RNA is generally detectable in upper and lower respiratory specimens during the acute phase of infection. Negative results do not preclude SARS-CoV-2 infection, do not rule out co-infections with other pathogens, and should not be used as the sole basis for treatment or other patient management decisions. Negative results must be combined with clinical observations, patient history, and epidemiological information. The expected result is Negative.  Fact Sheet for Patients: HairSlick.no  Fact Sheet for Healthcare Providers: quierodirigir.com  This test is not yet approved or cleared by the [place] FDA and  has been authorized for detection  and/or diagnosis of SARS-CoV-2 by FDA under an Emergency Use Authorization (EUA). This EUA will remain  in effect (meaning this test can be used) for the duration of the COVID-19 declaration under Se ction 564(b)(1) of the Act, 21 U.S.C. section 360bbb-3(b)(1), unless the authorization is terminated or revoked sooner.  Performed at [facility], [address] [place], [place] [address]      Labs: BNP (last 3 results) No  results for input(s): BNP in the last 8760 hours. Basic Metabolic Panel: Recent Labs  Lab 10/02/20 2306 10/03/20 0330  NA 139 140  K 3.6 3.8  CL 104 105  CO2 27 27  GLUCOSE 108* 93  BUN 17 19  CREATININE 0.69 0.54  CALCIUM 9.1 9.0   Liver Function Tests: Recent Labs  Lab 10/02/20 2306  AST 18  ALT 21  ALKPHOS 90  BILITOT 0.6  PROT 7.0  ALBUMIN 4.0   No results for input(s): LIPASE, AMYLASE in the last 168 hours. No results for input(s): AMMONIA in the last 168 hours. CBC: Recent Labs  Lab 10/02/20 2306 10/03/20 0330 10/04/20 0444  WBC 5.0 4.7 4.0  NEUTROABS  --   --  2.4  HGB 13.6 12.8 13.0  HCT 42.0 40.1 40.5  MCV 92.5 92.8 92.0  PLT 263 244 229   Cardiac Enzymes: No results for input(s): CKTOTAL, CKMB, CKMBINDEX, TROPONINI in the last 168 hours. BNP: Invalid input(s): POCBNP CBG: No results for input(s): GLUCAP in the last 168 hours. D-Dimer No results for input(s): DDIMER in the last 72 hours. Hgb A1c No results for input(s): HGBA1C in the last 72 hours. Lipid Profile No results for input(s): CHOL, HDL, LDLCALC, TRIG, CHOLHDL, LDLDIRECT in the last 72 hours. Thyroid function studies No results for input(s): TSH, T4TOTAL, T3FREE, THYROIDAB in the last 72 hours.  Invalid input(s): FREET3 Anemia work up No results for input(s): VITAMINB12, FOLATE, FERRITIN, TIBC, IRON, RETICCTPCT in the last 72 hours. Urinalysis    Component Value Date/Time   COLORURINE YELLOW 05/29/2020 0824   APPEARANCEUR CLEAR 05/29/2020 0824   LABSPEC 1.020 05/29/2020 0824   PHURINE 5.0 05/29/2020 0824   GLUCOSEU NEGATIVE 05/29/2020 0824   HGBUR SMALL (A) 05/29/2020 0824   BILIRUBINUR NEGATIVE 05/29/2020 0824   KETONESUR NEGATIVE 05/29/2020 0824   PROTEINUR NEGATIVE 05/29/2020 0824   UROBILINOGEN 0.2 05/25/2018 1211   NITRITE NEGATIVE 05/29/2020 0824   LEUKOCYTESUR NEGATIVE 05/29/2020 0824   Sepsis Labs Invalid input(s): PROCALCITONIN,  WBC,   LACTICIDVEN Microbiology Recent Results (from the past 240 hour(s))  Resp Panel by RT PCR (RSV, Flu A&B, Covid) - Nasopharyngeal Swab     Status: None   Collection Time: 10/03/20  2:43 AM   Specimen: Nasopharyngeal Swab  Result Value Ref Range Status   SARS Coronavirus 2 by RT PCR NEGATIVE NEGATIVE Final    Comment: (NOTE) SARS-CoV-2 target nucleic acids are NOT DETECTED.  The SARS-CoV-2 RNA is generally detectable in upper respiratoy specimens during the acute phase of infection. The lowest concentration of SARS-CoV-2 viral copies this assay can detect is 131 copies/mL. A negative result does not preclude SARS-Cov-2 infection and should not be used as the sole basis for treatment or other patient management decisions. A negative result may occur with  improper specimen collection/handling, submission of specimen other than nasopharyngeal swab, presence of viral mutation(s) within the areas targeted by this assay, and inadequate number of viral copies (<131 copies/mL). A negative result must be combined with clinical observations, patient history, and  epidemiological information. The expected result is Negative.  Fact Sheet for Patients:  https://www.moore.com/  Fact Sheet for Healthcare Providers:  https://www.young.biz/  This test is no t yet approved or cleared by the [place] FDA and  has been authorized for detection and/or diagnosis of SARS-CoV-2 by FDA under an Emergency Use Authorization (EUA). This EUA will remain  in effect (meaning this test can be used) for the duration of the COVID-19 declaration under Section 564(b)(1) of the Act, 21 U.S.C. section 360bbb-3(b)(1), unless the authorization is terminated or revoked sooner.     Influenza A by PCR NEGATIVE NEGATIVE Final   Influenza B by PCR NEGATIVE NEGATIVE Final    Comment: (NOTE) The Xpert Xpress SARS-CoV-2/FLU/RSV assay is intended as an aid in  the diagnosis of  influenza from Nasopharyngeal swab specimens and  should not be used as a sole basis for treatment. Nasal washings and  aspirates are unacceptable for Xpert Xpress SARS-CoV-2/FLU/RSV  testing.  Fact Sheet for Patients: https://www.moore.com/  Fact Sheet for Healthcare Providers: https://www.young.biz/  This test is not yet approved or cleared by the [place] FDA and  has been authorized for detection and/or diagnosis of SARS-CoV-2 by  FDA under an Emergency Use Authorization (EUA). This EUA will remain  in effect (meaning this test can be used) for the duration of the  Covid-19 declaration under Section 564(b)(1) of the Act, 21  U.S.C. section 360bbb-3(b)(1), unless the authorization is  terminated or revoked.    Respiratory Syncytial Virus by PCR NEGATIVE NEGATIVE Final    Comment: (NOTE) Fact Sheet for Patients: https://www.moore.com/  Fact Sheet for Healthcare Providers: https://www.young.biz/  This test is not yet approved or cleared by the [place] FDA and  has been authorized for detection and/or diagnosis of SARS-CoV-2 by  FDA under an Emergency Use Authorization (EUA). This EUA will remain  in effect (meaning this test can be used) for the duration of the  COVID-19 declaration under Section 564(b)(1) of the Act, 21 U.S.C.  section 360bbb-3(b)(1), unless the authorization is terminated or  revoked. Performed at [facility], [address] [place], [place] [address]   SARS CORONAVIRUS 2 (TAT 6-24 HRS) Nasopharyngeal Nasopharyngeal Swab     Status: None   Collection Time: 10/03/20  8:51 AM   Specimen: Nasopharyngeal Swab  Result Value Ref Range Status   SARS Coronavirus 2 NEGATIVE NEGATIVE Final    Comment: (NOTE) SARS-CoV-2 target nucleic acids are NOT DETECTED.  The SARS-CoV-2 RNA is generally detectable in upper and lower respiratory specimens during the  acute phase of infection. Negative results do not preclude SARS-CoV-2 infection, do not rule out co-infections with other pathogens, and should not be used as the sole basis for treatment or other patient management decisions. Negative results must be combined with clinical observations, patient history, and epidemiological information. The expected result is Negative.  Fact Sheet for Patients: HairSlick.no  Fact Sheet for Healthcare Providers: quierodirigir.com  This test is not yet approved or cleared by the [place] FDA and  has been authorized for detection and/or diagnosis of SARS-CoV-2 by FDA under an Emergency Use Authorization (EUA). This EUA will remain  in effect (meaning this test can be used) for the duration of the COVID-19 declaration under Se ction 564(b)(1) of the Act, 21 U.S.C. section 360bbb-3(b)(1), unless the authorization is terminated or revoked sooner.  Performed at [facility], [address], [place], [place] [address]     Please note: You were cared  for by a hospitalist during your hospital stay. Once you are discharged, your primary care physician will handle any further medical issues. Please note that NO REFILLS for any discharge medications will be authorized once you are discharged, as it is imperative that you return to your primary care physician (or establish a relationship with a primary care physician if you do not have one) for your post hospital discharge needs so that they can reassess your need for medications and monitor your lab values.    Time coordinating discharge: 40 minutes  SIGNED:   Burnadette Pop, MD  [facility] 10/04/2020, 12:41 PM Pager [phone]  If 7PM-7AM, please contact night-coverage [email or web address] Password [email or web address]

## 2020-10-04 NOTE — Discharge Instructions (Signed)
Lower Gastrointestinal Bleeding  Lower gastrointestinal (GI) bleeding is the result of bleeding from the colon, rectum, or anal area. The colon is the last part of the digestive tract, where stool, also called feces, is formed. If you have lower GI bleeding, you may see blood in or on your stool. It may be bright red. Lower GI bleeding often stops without treatment. Continued or heavy bleeding needs emergency treatment at the hospital. What are the causes? Lower GI bleeding may be caused by:  A condition that causes pouches to form in the colon over time (diverticulosis).  Swelling and irritation (inflammation) in areas with diverticulosis (diverticulitis).  Inflammation of the colon (inflammatory bowel disease).  Swollen veins in the rectum (hemorrhoids).  Painful tears in the anus (anal fissures), often caused by passing hard stools.  Cancer of the colon or rectum.  Noncancerous growths (polyps) of the colon or rectum.  A bleeding disorder that impairs the formation of blood clots and causes easy bleeding (coagulopathy).  An abnormal weakening of a blood vessel where an artery and a vein come together (arteriovenous malformation). What increases the risk? You are more likely to develop this condition if:  You are older than 58 years of age.  You take aspirin or NSAIDs on a regular basis.  You take anticoagulant or antiplatelet drugs.  You have a history of high-dose X-ray treatment (radiation therapy) of the colon.  You recently had a colon polyp removed. What are the signs or symptoms? Symptoms of this condition include:  Bright red blood or blood clots coming from your rectum.  Bloody stools.  Black or maroon-colored stools.  Pain or cramping in the abdomen.  Weakness or dizziness.  Racing heartbeat. How is this diagnosed? This condition may be diagnosed based on:  Your symptoms and medical history.  A physical exam. During the exam, your health care  provider will check for signs of blood loss, such as low blood pressure and a rapid pulse.  Tests, such as: ? Flexible sigmoidoscopy. In this procedure, a flexible tube with a camera on the end is used to examine your anus and the first part of your colon to look for the source of bleeding. ? Colonoscopy. This is similar to a flexible sigmoidoscopy, but the camera can extend all the way to the uppermost part of your colon. ? Blood tests to measure your red blood cell count and to check for coagulopathy. ? An imaging study of your colon to look for a bleeding site. In some cases, you may have X-rays taken after a dye or radioactive substance is injected into your bloodstream (angiogram). How is this treated? Treatment for this condition depends on the cause of the bleeding. Heavy or persistent bleeding is treated at the hospital. Treatment may include:  Getting fluids through an IV tube inserted into one of your veins.  Getting blood through an IV tube (blood transfusion).  Stopping bleeding through high-heat coagulation, injections of certain medicines, or applying surgical clips. This can all be done during a colonoscopy.  Having a procedure that involves first doing an angiogram and then blocking blood flow to the bleeding site (embolization).  Stopping some of your regular medicines for a certain amount of time.  Having surgery to remove part of the colon. This may be needed if bleeding is severe and does not respond to other treatment. Follow these instructions at home:  Take over-the-counter and prescription medicines only as told by your health care provider. You may need to   avoid aspirin, NSAIDs, or other medicines that increase bleeding.  Eat foods that are high in fiber. This will help keep your stools soft. These foods include whole grains, legumes, fruits, and vegetables. Eating 1-3 prunes each day works well for many people.  Drink enough fluid to keep your urine clear or pale  yellow.  Keep all follow-up visits as told by your health care provider. This is important. Contact a health care provider if:  Your symptoms do not improve. Get help right away if:  Your bleeding increases.  You feel light-headed or you faint.  You feel weak.  You have severe cramps in your back or abdomen.  You pass large blood clots in your stool.  Your symptoms get worse. This information is not intended to replace advice given to you by your health care provider. Make sure you discuss any questions you have with your health care provider. Document Revised: 03/19/2019 Document Reviewed: 04/11/2016 Elsevier Patient Education  2020 [employer]  

## 2020-10-04 NOTE — Progress Notes (Signed)
Discharge instructions given with stated understanding 

## 2021-07-12 ENCOUNTER — Emergency Department (HOSPITAL_COMMUNITY)
Admission: EM | Admit: 2021-07-12 | Discharge: 2021-07-12 | Disposition: A | Discharge status: Home / Self Care | Payer: Managed Care, Other (non HMO) | Attending: Emergency Medicine | Admitting: Emergency Medicine

## 2021-07-12 ENCOUNTER — Other Ambulatory Visit: Payer: Self-pay

## 2021-07-12 ENCOUNTER — Encounter (HOSPITAL_COMMUNITY): Payer: Self-pay

## 2021-07-12 DIAGNOSIS — F17210 Nicotine dependence, cigarettes, uncomplicated: Secondary | ICD-10-CM | POA: Diagnosis not present

## 2021-07-12 DIAGNOSIS — I10 Essential (primary) hypertension: Secondary | ICD-10-CM | POA: Diagnosis not present

## 2021-07-12 DIAGNOSIS — R040 Epistaxis: Secondary | ICD-10-CM

## 2021-07-12 DIAGNOSIS — R030 Elevated blood-pressure reading, without diagnosis of hypertension: Secondary | ICD-10-CM | POA: Diagnosis present

## 2021-07-12 MED ORDER — AMLODIPINE BESYLATE 5 MG PO TABS: 5.0000 mg | ORAL_TABLET | Freq: Every day | ORAL | 0 refills | Status: AC

## 2021-07-12 NOTE — ED Provider Notes (Signed)
[facility]-EMERGENCY DEPT Provider Note   CSN: [id number] Arrival date & time: 07/12/21  0131     History Chief Complaint  Patient presents with   Epistaxis   Hypertension    Stacey Fuller is a 60 y.o. female.  HPI     This is a 60 year old female with no reported past medical history who presents with epistaxis and high blood pressure.  Patient reports that she had a nosebleed out of the right naris today after smoking a cigarette.  She went to work around 10 PM and had a second nosebleed out of the right naris.  She denies any history of nosebleeds.  She is not on any anticoagulants.  She states that a coworker took her blood pressure and it was "extremely high."  She is not having any headache, strokelike symptoms, chest pain, shortness of breath at that time.  She reports that it was 200s over 100s.  EMS brought the patient to the emergency room.  She is currently asymptomatic.  She denies history of high blood pressure and is not currently on any medications.  Again denies any chest pain, strokelike symptoms, shortness of breath.  Past Medical History:  Diagnosis Date   Allergy     Patient Active Problem List   Diagnosis Date Noted   BRBPR (bright red blood per rectum) 10/03/2020    Past Surgical History:  Procedure Laterality Date   ABDOMINAL HYSTERECTOMY     CESAREAN SECTION     COLONOSCOPY Left 10/03/2020   Procedure: COLONOSCOPY;  Surgeon: Jeani Hawking, MD;  Location: [facility];  Service: Endoscopy;  Laterality: Left;     OB History   No obstetric history on file.     Family History  Adopted: Yes  Problem Relation Age of Onset   Cancer Sister        late 52s, breast cancer   Cancer Sister        early 57s, breast cancer    Social History   Tobacco Use   Smoking status: Every Day    Packs/day: 1.00    Years: 25.00    Pack years: 25.00    Types: Cigarettes   Smokeless tobacco: Never   Tobacco comments:    25 PLUS years   Vaping Use   Vaping Use: Never used  Substance Use Topics   Alcohol use: Yes    Alcohol/week: 1.0 - 2.0 standard drink    Types: 1 - 2 Standard drinks or equivalent per week   Drug use: Not Currently    Comment: marijuana     Home Medications Prior to Admission medications   Medication Sig Start Date End Date Taking? Authorizing Provider  amLODipine (NORVASC) 5 MG tablet Take 1 tablet (5 mg total) by mouth daily. 07/12/21  Yes Jamiracle Avants, Mayer Masker, MD  OVER THE COUNTER MEDICATION Take 1 tablet by mouth daily.     [provider]  polyethylene glycol (MIRALAX) 17 g packet Take 17 g by mouth daily as needed. 10/04/20   Burnadette Pop, MD    Allergies    Patient has no known allergies.  Review of Systems   Review of Systems  Constitutional:  Negative for fever.  HENT:  Positive for nosebleeds.   Respiratory:  Negative for shortness of breath.   Cardiovascular:  Negative for chest pain.  Gastrointestinal:  Negative for abdominal pain, nausea and vomiting.  Genitourinary:  Negative for dysuria.  All other systems reviewed and are negative.  Physical Exam  Updated Vital Signs BP (!) 169/106   Pulse 68   Temp 98.5 F (36.9 C) (Oral)   Resp 20   Ht 1.549 m (5\' 1" )   Wt 95.7 kg   SpO2 100%   BMI 39.87 kg/m   Physical Exam Vitals and nursing note reviewed.  Constitutional:      Appearance: She is well-developed. She is not ill-appearing.  HENT:     Head: Normocephalic and atraumatic.     Nose: Nose normal.     Comments: No evidence of active nosebleed, no friability noted of the nasal septum, no septal hematomas noted    Mouth/Throat:     Mouth: Mucous membranes are moist.  Eyes:     Pupils: Pupils are equal, round, and reactive to light.  Cardiovascular:     Rate and Rhythm: Normal rate and regular rhythm.     Heart sounds: Normal heart sounds.  Pulmonary:     Effort: Pulmonary effort is normal. No respiratory distress.  Abdominal:     Palpations: Abdomen  is soft.     Tenderness: There is no abdominal tenderness.  Musculoskeletal:     Cervical back: Neck supple.     Right lower leg: No edema.     Left lower leg: No edema.  Skin:    General: Skin is warm and dry.  Neurological:     Mental Status: She is alert and oriented to person, place, and time.  Psychiatric:        Mood and Affect: Mood normal.    ED Results / Procedures / Treatments   Labs (all labs ordered are listed, but only abnormal results are displayed) Labs Reviewed - No data to display  EKG None  Radiology No results found.  Procedures Procedures   Medications Ordered in ED Medications - No data to display  ED Course  I have reviewed the triage vital signs and the nursing notes.  Pertinent labs & imaging results that were available during my care of the patient were reviewed by me and considered in my medical decision making (see chart for details).    MDM Rules/Calculators/A&P                           Patient presents with epistaxis which has resolved.  Also her concern for high blood pressure.  She is overall nontoxic on my evaluation.  She is currently asymptomatic.  No active nosebleed.  Blood pressure 169/106.  Patient denies any history of hypertension but chart review reveals blood pressures at multiple emergency visits in the 150-160/90-100 range.  She is not currently medicated for this.  She is asymptomatic.  I do not feel she needs any work-up at this time.  Given chart review and elevated blood pressure here today, will start on Norvasc 5 mg as this does not require any labs.  She does need to establish primary care for blood pressure recheck and medication adjustments.  After history, exam, and medical workup I feel the patient has been appropriately medically screened and is safe for discharge home. Pertinent diagnoses were discussed with the patient. Patient was given return precautions.  Final Clinical Impression(s) / ED Diagnoses Final  diagnoses:  Asymptomatic hypertension  Epistaxis    Rx / DC Orders ED Discharge Orders          Ordered    amLODipine (NORVASC) 5 MG tablet  Daily        07/12/21 0523  Shon Baton, MD 07/12/21 [phone]

## 2021-07-12 NOTE — Discharge Instructions (Addendum)
You were seen today for concerns for hypertension. You also had a nosebleed.  Based on your chart review, you do have evidence of history of high blood pressure.  We started on a blood pressure medication.  Establish primary care for blood pressure recheck and adjustment of your medication.

## 2021-07-12 NOTE — ED Triage Notes (Signed)
Pt reports having multiple nosebleeds at work. Pt states that she was smoking a cigarette and her nose started to bleed. Pt reports having high blood pressure.

## 2021-07-12 NOTE — ED Notes (Signed)
Pt states the epistaxis lasted approximately 4 minutes, left nostril only.  Denies any pain/dizziness with episode.

## 2021-07-12 NOTE — ED Provider Notes (Signed)
Emergency Medicine Provider Triage Evaluation Note  Stacey Fuller , a 60 y.o. female  was evaluated in triage.  Pt complains of nosebleed x2.  Patient reports she went outside earlier this afternoon to smoke a cigarette and had a small nosebleed which stopped spontaneously.  The same thing happened this evening when she went outside to smoke on her smoke break.  When she came back and her colleagues took her blood pressure and found it to be high.  They felt she should come and get evaluated.  Patient denies chest pain, shortness of breath, syncope, near syncope, headache, vision changes no abdominal pain, nausea, vomiting, diarrhea, diaphoresis.  Per EMS blood pressure was 184/118.  Review of Systems  Positive: Nosebleed -now resolved Negative: Chest pain, shortness of breath, headache, vision changes, numbness, tingling, weakness  Physical Exam  BP (!) 164/98 (BP Location: Left Arm)   Pulse 67   Temp 98.5 F (36.9 C) (Oral)   Resp 15   Ht 5\' 1"  (1.549 m)   Wt 95.7 kg   SpO2 98%   BMI 39.87 kg/m  Gen:   Awake, no distress   Resp:  Normal effort  MSK:   Moves extremities without difficulty  Other:  No persistent bleeding, no facial droop, ambulates with steady gait  Medical Decision Making  Medically screening exam initiated at 2:02 AM.  Appropriate orders placed.  Stacey Fuller was informed that the remainder of the evaluation will be completed by another provider, this initial triage assessment does not replace that evaluation, and the importance of remaining in the ED until their evaluation is complete.  Hypertension, nosebleed after smoking.   Stacey Fuller, Stacey Fuller 07/12/21 09/11/21    [id number], MD 07/12/21 [phone]

## 2021-07-14 ENCOUNTER — Other Ambulatory Visit: Payer: Self-pay

## 2021-07-14 ENCOUNTER — Encounter (HOSPITAL_BASED_OUTPATIENT_CLINIC_OR_DEPARTMENT_OTHER): Payer: Self-pay | Admitting: Emergency Medicine

## 2021-07-14 ENCOUNTER — Emergency Department (HOSPITAL_BASED_OUTPATIENT_CLINIC_OR_DEPARTMENT_OTHER)
Admission: EM | Admit: 2021-07-14 | Discharge: 2021-07-14 | Disposition: A | Discharge status: Home / Self Care | Payer: Managed Care, Other (non HMO) | Attending: Emergency Medicine | Admitting: Emergency Medicine

## 2021-07-14 DIAGNOSIS — I10 Essential (primary) hypertension: Secondary | ICD-10-CM | POA: Diagnosis not present

## 2021-07-14 DIAGNOSIS — R040 Epistaxis: Secondary | ICD-10-CM

## 2021-07-14 DIAGNOSIS — F17210 Nicotine dependence, cigarettes, uncomplicated: Secondary | ICD-10-CM | POA: Insufficient documentation

## 2021-07-14 DIAGNOSIS — Z79899 Other long term (current) drug therapy: Secondary | ICD-10-CM | POA: Diagnosis not present

## 2021-07-14 DIAGNOSIS — R030 Elevated blood-pressure reading, without diagnosis of hypertension: Secondary | ICD-10-CM

## 2021-07-14 HISTORY — DX: Essential (primary) hypertension: I10

## 2021-07-14 LAB — BASIC METABOLIC PANEL
Anion gap: 7 (ref 5–15)
BUN: 16 mg/dL (ref 6–20)
CO2: 28 mmol/L (ref 22–32)
Calcium: 9.7 mg/dL (ref 8.9–10.3)
Chloride: 104 mmol/L (ref 98–111)
Creatinine, Ser: 0.65 mg/dL (ref 0.44–1.00)
GFR, Estimated: >60 mL/min (ref >60)
Glucose, Bld: 104 mg/dL — ABNORMAL HIGH (ref 70–99)
Potassium: 4 mmol/L (ref 3.5–5.1)
Sodium: 139 mmol/L (ref 135–145)

## 2021-07-14 LAB — CBC WITH DIFFERENTIAL/PLATELET
Abs Immature Granulocytes: 0.01 10*3/uL (ref 0.00–0.07)
Basophils Absolute: 0 10*3/uL (ref 0.0–0.1)
Basophils Relative: 0 %
Eosinophils Absolute: 0.1 10*3/uL (ref 0.0–0.5)
Eosinophils Relative: 2 %
HCT: 42.2 % (ref 36.0–46.0)
Hemoglobin: 14 g/dL (ref 12.0–15.0)
Immature Granulocytes: 0 %
Lymphocytes Relative: 19 %
Lymphs Abs: 1.1 10*3/uL (ref 0.7–4.0)
MCH: 30 pg (ref 26.0–34.0)
MCHC: 33.2 g/dL (ref 30.0–36.0)
MCV: 90.4 fL (ref 80.0–100.0)
Monocytes Absolute: 0.5 10*3/uL (ref 0.1–1.0)
Monocytes Relative: 9 %
Neutro Abs: 3.9 10*3/uL (ref 1.7–7.7)
Neutrophils Relative %: 70 %
Platelets: 266 10*3/uL (ref 150–400)
RBC: 4.67 MIL/uL (ref 3.87–5.11)
RDW: 13.2 % (ref 11.5–15.5)
WBC: 5.6 10*3/uL (ref 4.0–10.5)
nRBC: 0 % (ref 0.0–0.2)

## 2021-07-14 LAB — PROTIME-INR
INR: 1 (ref 0.8–1.2)
Prothrombin Time: 13.4 seconds (ref 11.4–15.2)

## 2021-07-14 MED ORDER — AMLODIPINE BESYLATE 5 MG PO TABS
5.0000 mg | ORAL_TABLET | Freq: Once | ORAL | Status: AC
  Administered 2021-07-14: 5 mg via ORAL
  Filled 2021-07-14: qty 1

## 2021-07-14 NOTE — Discharge Instructions (Addendum)
Keep a record of your blood pressure on a daily basis and follow-up with Dr. Thomasena Edis for medication adjustments.  You may try moisturizing your nose at night with Vaseline before wearing your mask.  If bleeding recurs hold pressure for 30 minutes at a time.   Return to the ED if you develop new or worsening symptoms.

## 2021-07-14 NOTE — ED Provider Notes (Signed)
[facility] EMERGENCY DEPARTMENT Provider Note   CSN: [id number] Arrival date & time: 07/14/21  0144     History Chief Complaint  Patient presents with   Epistaxis    Stacey Fuller is a 60 y.o. female.  Patient presents via EMS with nosebleed tonight x2.  States she was lying in bed when she started bleeding out of her right nare with blood in the back of her throat.  She also had a nosebleed earlier in the day when she was moving a case of water.  EMS gave her Afrin with some resolution of her nosebleed.  She was seen for similar presentation 2 days ago at [facility].  At that time she had elevated blood pressure was started on amlodipine.  Denies a history of blood pressure problems.  Denies any blood thinner use.  Denies any dizziness or lightheadedness.  No chest pain or shortness of breath.  No difficulty breathing or difficulty swallowing.  No focal weakness, numbness or tingling. Denies any nasal trauma. Did not take her amlodipine today yet No headache or vision changes  The history is provided by the patient and the EMS personnel.  Epistaxis Associated symptoms: no congestion, no dizziness, no fever and no headaches       Past Medical History:  Diagnosis Date   Allergy    Hypertension     Patient Active Problem List   Diagnosis Date Noted   BRBPR (bright red blood per rectum) 10/03/2020    Past Surgical History:  Procedure Laterality Date   ABDOMINAL HYSTERECTOMY     CESAREAN SECTION     COLONOSCOPY Left 10/03/2020   Procedure: COLONOSCOPY;  Surgeon: Jeani Hawking, MD;  Location: [facility];  Service: Endoscopy;  Laterality: Left;     OB History   No obstetric history on file.     Family History  Adopted: Yes  Problem Relation Age of Onset   Cancer Sister        late 71s, breast cancer   Cancer Sister        early 42s, breast cancer    Social History   Tobacco Use   Smoking status: Every Day    Packs/day: 1.00    Years: 25.00     Pack years: 25.00    Types: Cigarettes   Smokeless tobacco: Never   Tobacco comments:    25 PLUS years  Vaping Use   Vaping Use: Never used  Substance Use Topics   Alcohol use: Yes    Alcohol/week: 1.0 - 2.0 standard drink    Types: 1 - 2 Standard drinks or equivalent per week   Drug use: Not Currently    Comment: marijuana     Home Medications Prior to Admission medications   Medication Sig Start Date End Date Taking? Authorizing Provider  amLODipine (NORVASC) 5 MG tablet Take 1 tablet (5 mg total) by mouth daily. 07/12/21   Horton, Mayer Masker, MD  OVER THE COUNTER MEDICATION Take 1 tablet by mouth daily.     [provider]  polyethylene glycol (MIRALAX) 17 g packet Take 17 g by mouth daily as needed. 10/04/20   Burnadette Pop, MD    Allergies    Patient has no known allergies.  Review of Systems   Review of Systems  Constitutional:  Negative for activity change, appetite change and fever.  HENT:  Positive for nosebleeds. Negative for congestion.   Eyes:  Negative for photophobia.  Respiratory:  Negative for choking, chest tightness  and shortness of breath.   Cardiovascular:  Negative for chest pain.  Gastrointestinal:  Negative for abdominal pain, nausea and vomiting.  Genitourinary:  Negative for dysuria and hematuria.  Musculoskeletal:  Negative for arthralgias and myalgias.  Skin:  Negative for rash.  Neurological:  Negative for dizziness, weakness, light-headedness and headaches.   all other systems are negative except as noted in the HPI and PMH.   Physical Exam Updated Vital Signs BP (!) 175/93   Pulse 64   Temp 98.7 F (37.1 C) (Oral)   Resp 16   Ht 5\' 1"  (1.549 m)   Wt 95.7 kg   SpO2 100%   BMI 39.86 kg/m   Physical Exam Vitals and nursing note reviewed.  Constitutional:      General: She is not in acute distress.    Appearance: She is well-developed.  HENT:     Head: Normocephalic and atraumatic.     Nose:     Comments: No active  nosebleed.  There is some dried blood on the nasal septum.  There is no septal hematoma.  No blood in posterior oropharynx    Mouth/Throat:     Pharynx: No oropharyngeal exudate.  Eyes:     Conjunctiva/sclera: Conjunctivae normal.     Pupils: Pupils are equal, round, and reactive to light.  Neck:     Comments: No meningismus. Cardiovascular:     Rate and Rhythm: Normal rate and regular rhythm.     Heart sounds: Normal heart sounds. No murmur heard. Pulmonary:     Effort: Pulmonary effort is normal. No respiratory distress.     Breath sounds: Normal breath sounds.  Chest:     Chest wall: No tenderness.  Abdominal:     Palpations: Abdomen is soft.     Tenderness: There is no abdominal tenderness. There is no guarding or rebound.  Musculoskeletal:        General: No tenderness. Normal range of motion.     Cervical back: Normal range of motion and neck supple.  Skin:    General: Skin is warm.  Neurological:     General: No focal deficit present.     Mental Status: She is alert and oriented to person, place, and time. Mental status is at baseline.     Cranial Nerves: No cranial nerve deficit.     Motor: No abnormal muscle tone.     Coordination: Coordination normal.     Comments:  5/5 strength throughout. CN 2-12 intact.Equal grip strength.   Psychiatric:        Behavior: Behavior normal.    ED Results / Procedures / Treatments   Labs (all labs ordered are listed, but only abnormal results are displayed) Labs Reviewed  BASIC METABOLIC PANEL - Abnormal; Notable for the following components:      Result Value   Glucose, Bld 104 (*)    All other components within normal limits  CBC WITH DIFFERENTIAL/PLATELET  PROTIME-INR    EKG None  Radiology No results found.  Procedures Procedures   Medications Ordered in ED Medications  amLODipine (NORVASC) tablet 5 mg (has no administration in time range)    ED Course  I have reviewed the triage vital signs and the nursing  notes.  Pertinent labs & imaging results that were available during my care of the patient were reviewed by me and considered in my medical decision making (see chart for details).    MDM Rules/Calculators/A&P  Recurrent nosebleed with elevated blood pressure.  No chest pain or shortness of breath.  No difficulty breathing or difficulty swallowing.  No blood thinner use.  Given second visit will obtain lab work to ensure normal blood counts and platelet counts.  Home blood pressure medication given. No evidence of recurrent bleeding throughout ED course.  Blood pressure remains elevated.  Discussed taking her blood pressure medication as prescribed and following up with her primary care doctor.  Her lab work is normal.  She is requesting ENT referral as well.  Advised that she should stop smoking. Return precautions discussed Final Clinical Impression(s) / ED Diagnoses Final diagnoses:  Right-sided epistaxis  Elevated blood pressure reading    Rx / DC Orders ED Discharge Orders     None        Mayana Irigoyen, Jeannett Senior, MD 07/14/21 0430

## 2021-07-14 NOTE — ED Triage Notes (Signed)
Pt reports nose bleeding tonight from right nare. Pt was given afrin by EMS with resolution of nose bleed. Pt was hypertensive with EMS, was just started on amlodipine x 2 days ago. Pt was seen at [facility] 2 days ago for same.

## 2021-07-14 NOTE — ED Notes (Signed)
Toothbrush and toothpaste was given to Pt per request.

## 2021-09-21 ENCOUNTER — Ambulatory Visit: Payer: PRIVATE HEALTH INSURANCE | Admitting: Internal Medicine

## 2022-03-04 DIAGNOSIS — M25571 Pain in right ankle and joints of right foot: Secondary | ICD-10-CM | POA: Diagnosis not present

## 2022-03-04 DIAGNOSIS — M25531 Pain in right wrist: Secondary | ICD-10-CM | POA: Diagnosis not present

## 2022-03-04 DIAGNOSIS — M25532 Pain in left wrist: Secondary | ICD-10-CM | POA: Diagnosis not present

## 2022-03-04 DIAGNOSIS — R200 Anesthesia of skin: Secondary | ICD-10-CM | POA: Diagnosis not present

## 2022-06-26 DIAGNOSIS — R946 Abnormal results of thyroid function studies: Secondary | ICD-10-CM | POA: Diagnosis not present

## 2022-06-26 DIAGNOSIS — E7800 Pure hypercholesterolemia, unspecified: Secondary | ICD-10-CM | POA: Diagnosis not present

## 2022-06-26 DIAGNOSIS — R7303 Prediabetes: Secondary | ICD-10-CM | POA: Diagnosis not present

## 2022-06-26 DIAGNOSIS — I10 Essential (primary) hypertension: Secondary | ICD-10-CM | POA: Diagnosis not present

## 2022-07-04 DIAGNOSIS — L309 Dermatitis, unspecified: Secondary | ICD-10-CM | POA: Diagnosis not present

## 2022-07-04 DIAGNOSIS — Z0000 Encounter for general adult medical examination without abnormal findings: Secondary | ICD-10-CM | POA: Diagnosis not present

## 2022-07-04 DIAGNOSIS — R7303 Prediabetes: Secondary | ICD-10-CM | POA: Diagnosis not present

## 2022-07-04 DIAGNOSIS — I10 Essential (primary) hypertension: Secondary | ICD-10-CM | POA: Diagnosis not present

## 2022-07-12 DIAGNOSIS — Z6839 Body mass index (BMI) 39.0-39.9, adult: Secondary | ICD-10-CM | POA: Diagnosis not present

## 2022-07-12 DIAGNOSIS — Z1231 Encounter for screening mammogram for malignant neoplasm of breast: Secondary | ICD-10-CM | POA: Diagnosis not present

## 2022-07-12 DIAGNOSIS — Z0142 Encounter for cervical smear to confirm findings of recent normal smear following initial abnormal smear: Secondary | ICD-10-CM | POA: Diagnosis not present

## 2022-07-12 DIAGNOSIS — Z01419 Encounter for gynecological examination (general) (routine) without abnormal findings: Secondary | ICD-10-CM | POA: Diagnosis not present

## 2022-07-29 ENCOUNTER — Other Ambulatory Visit: Payer: Self-pay | Admitting: Obstetrics & Gynecology

## 2022-07-29 DIAGNOSIS — E2839 Other primary ovarian failure: Secondary | ICD-10-CM

## 2022-08-01 ENCOUNTER — Ambulatory Visit
Admission: RE | Admit: 2022-08-01 | Discharge: 2022-08-01 | Disposition: A | Discharge status: Home / Self Care | Payer: BC Managed Care – PPO | Source: Ambulatory Visit | Attending: Obstetrics & Gynecology | Admitting: Obstetrics & Gynecology

## 2022-08-01 DIAGNOSIS — E2839 Other primary ovarian failure: Secondary | ICD-10-CM

## 2022-08-13 DIAGNOSIS — Z111 Encounter for screening for respiratory tuberculosis: Secondary | ICD-10-CM | POA: Diagnosis not present

## 2022-12-21 ENCOUNTER — Encounter (HOSPITAL_BASED_OUTPATIENT_CLINIC_OR_DEPARTMENT_OTHER): Payer: Self-pay

## 2022-12-21 ENCOUNTER — Other Ambulatory Visit: Payer: Self-pay

## 2022-12-21 ENCOUNTER — Emergency Department (HOSPITAL_BASED_OUTPATIENT_CLINIC_OR_DEPARTMENT_OTHER): Payer: BC Managed Care – PPO

## 2022-12-21 ENCOUNTER — Emergency Department (HOSPITAL_BASED_OUTPATIENT_CLINIC_OR_DEPARTMENT_OTHER)
Admission: EM | Admit: 2022-12-21 | Discharge: 2022-12-21 | Disposition: A | Discharge status: Home / Self Care | Payer: BC Managed Care – PPO | Attending: Emergency Medicine | Admitting: Emergency Medicine

## 2022-12-21 DIAGNOSIS — F17210 Nicotine dependence, cigarettes, uncomplicated: Secondary | ICD-10-CM | POA: Diagnosis not present

## 2022-12-21 DIAGNOSIS — M436 Torticollis: Secondary | ICD-10-CM | POA: Insufficient documentation

## 2022-12-21 DIAGNOSIS — M5412 Radiculopathy, cervical region: Secondary | ICD-10-CM | POA: Diagnosis not present

## 2022-12-21 DIAGNOSIS — I10 Essential (primary) hypertension: Secondary | ICD-10-CM | POA: Insufficient documentation

## 2022-12-21 DIAGNOSIS — M542 Cervicalgia: Secondary | ICD-10-CM | POA: Diagnosis not present

## 2022-12-21 MED ORDER — PREDNISONE 10 MG PO TABS: 40.0000 mg | ORAL_TABLET | Freq: Every day | ORAL | 0 refills | Status: DC

## 2022-12-21 MED ORDER — PREDNISONE 50 MG PO TABS
60.0000 mg | ORAL_TABLET | Freq: Once | ORAL | Status: AC
  Administered 2022-12-21: 60 mg via ORAL
  Filled 2022-12-21: qty 1

## 2022-12-21 NOTE — ED Triage Notes (Signed)
Pt c/o posterior L neck pain x2 week.  Pt reports intermittent pain and swelling in L shoulder and R neck.  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  Pain score 8/10.  Pt has been taking muscle relaxers w/ some relief.  Denies numbness and tingling in arms.

## 2022-12-21 NOTE — ED Provider Notes (Signed)
[facility] EMERGENCY DEPT Provider Note   CSN: [id number] Arrival date & time: 12/21/22  9379     History  Chief Complaint  Patient presents with   Neck Pain    Stacey Fuller is a 62 y.o. female.  Patient with a complaint of left-sided neck pain for 2 weeks.  No fall no injury.  Treated with a muscle relaxer with some improvement but it is not gone away.  Denies any numbness or weakness or radiation of pain into the upper extremities.  Past medical history significant for history of hypertension and patient's had an abdominal hysterectomy.  Patient is an everyday smoker.       Home Medications Prior to Admission medications   Medication Sig Start Date End Date Taking? Authorizing Provider  predniSONE (DELTASONE) 10 MG tablet Take 4 tablets (40 mg total) by mouth daily. 12/21/22  Yes Fredia Sorrow, MD  amLODipine (NORVASC) 5 MG tablet Take 1 tablet (5 mg total) by mouth daily. 07/12/21   Horton, Barbette Hair, MD  OVER THE COUNTER MEDICATION Take 1 tablet by mouth daily.     [provider]  polyethylene glycol (MIRALAX) 17 g packet Take 17 g by mouth daily as needed. 10/04/20   Shelly Coss, MD      Allergies    Patient has no known allergies.    Review of Systems   Review of Systems  Constitutional:  Negative for chills and fever.  HENT:  Negative for ear pain and sore throat.   Eyes:  Negative for pain and visual disturbance.  Respiratory:  Negative for cough and shortness of breath.   Cardiovascular:  Negative for chest pain and palpitations.  Gastrointestinal:  Negative for abdominal pain and vomiting.  Genitourinary:  Negative for dysuria and hematuria.  Musculoskeletal:  Positive for neck pain. Negative for arthralgias and back pain.  Skin:  Negative for color change and rash.  Neurological:  Negative for seizures, syncope, facial asymmetry, weakness, numbness and headaches.  All other systems reviewed and are negative.   Physical  Exam Updated Vital Signs BP (!) 144/76 (BP Location: Left Arm)   Pulse 72   Temp 97.6 F (36.4 C) (Oral)   Resp 18   Ht 1.549 m (5\' 1" )   Wt 95.3 kg   SpO2 99%   BMI 39.68 kg/m  Physical Exam Vitals and nursing note reviewed.  Constitutional:      General: She is not in acute distress.    Appearance: Normal appearance. She is well-developed.  HENT:     Head: Normocephalic and atraumatic.  Eyes:     Extraocular Movements: Extraocular movements intact.     Conjunctiva/sclera: Conjunctivae normal.     Pupils: Pupils are equal, round, and reactive to light.  Neck:     Comments: Or to palpation left lateral aspect of the neck.  Sternocleidomastoid muscle not tender.  Left upper extremity with great grip strength and sensation intact radial pulse 2+.  Same for right upper extremity. Cardiovascular:     Rate and Rhythm: Normal rate and regular rhythm.     Heart sounds: No murmur heard. Pulmonary:     Effort: Pulmonary effort is normal. No respiratory distress.     Breath sounds: Normal breath sounds.  Abdominal:     Palpations: Abdomen is soft.     Tenderness: There is no abdominal tenderness.  Musculoskeletal:        General: No swelling.     Cervical back: Normal range of motion  and neck supple. Tenderness present.  Skin:    General: Skin is warm and dry.     Capillary Refill: Capillary refill takes less than 2 seconds.  Neurological:     General: No focal deficit present.     Mental Status: She is alert and oriented to person, place, and time.     Cranial Nerves: No cranial nerve deficit.     Sensory: No sensory deficit.  Psychiatric:        Mood and Affect: Mood normal.     ED Results / Procedures / Treatments   Labs (all labs ordered are listed, but only abnormal results are displayed) Labs Reviewed - No data to display  EKG EKG Interpretation  Date/Time:  Saturday December 21 2022 07:42:12 EST Ventricular Rate:  67 PR Interval:  222 QRS Duration: 82 QT  Interval:  380 QTC Calculation: 401 R Axis:   6 Text Interpretation: Sinus rhythm with 1st degree A-V block Otherwise normal ECG When compared with ECG of 29-May-2020 07:35, PREVIOUS ECG IS PRESENT No significant change since last tracing Confirmed by Vanetta Mulders [phone]) on 12/21/2022 9:58:32 AM  Radiology CT Cervical Spine Wo Contrast  Result Date: 12/21/2022 CLINICAL DATA:  62 year old female with acute neck pain. Cervical radiculopathy. EXAM: CT CERVICAL SPINE WITHOUT CONTRAST TECHNIQUE: Multidetector CT imaging of the cervical spine was performed without intravenous contrast. Multiplanar CT image reconstructions were also generated. RADIATION DOSE REDUCTION: This exam was performed according to the departmental dose-optimization program which includes automated exposure control, adjustment of the mA and/or kV according to patient size and/or use of iterative reconstruction technique. COMPARISON:  None Available. FINDINGS: Alignment: Straightening and mild reversal of cervical lordosis. Cervicothoracic junction alignment is within normal limits. Bilateral posterior element alignment is within normal limits. Skull base and vertebrae: Visualized skull base is intact. No atlanto-occipital dissociation. C1 and C2 appear intact and aligned. No acute osseous abnormality identified. Soft tissues and spinal canal: No prevertebral fluid or swelling. No visible canal hematoma. Elongated bilateral stylohyoid ligament calcification. Partially retropharyngeal course of both carotids. Disc levels: Chronic severe anterior C1-C2 degeneration, subchondral sclerosis and cysts there. C2-C3 facet ankylosis on the right. Superimposed Diffuse idiopathic skeletal hyperostosis (DISH). Bulky flowing anterior endplate osteophytes beginning at C4 and C5. C6-C7 ankylosis. Superimposed subtle anterolisthesis of C3 on C4 with moderate to severe left side facet degeneration there. Similar mild anterolisthesis C4-C5 with only mild  facet hypertrophy. Mild to moderate facet degeneration at C7-T1 which is unfused. No strong evidence of cervical spinal stenosis. Upper chest: Thoracic flowing endplate osteophytes continue on the right. Negative lung apices. Other: Calcified atherosclerosis at the skull base. Grossly negative visible noncontrast brain parenchyma. IMPRESSION: 1. No acute osseous abnormality in the cervical spine. 2. 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 No CT evidence of cervical spinal stenosis. 3. Partially retropharyngeal course of both carotid arteries. Electronically Signed   By: Odessa Fleming M.D.   On: 12/21/2022 10:38    Procedures Procedures    Medications Ordered in ED Medications  predniSONE (DELTASONE) tablet 60 mg (60 mg Oral Given 12/21/22 1143)    ED Course/ Medical Decision Making/ A&P                             Medical Decision Making Amount and/or Complexity of Data Reviewed Radiology: ordered.  Risk Prescription drug management.  Patient without any evidence of cervical radiculopathy.  Symptoms  certainly could be due to cervical degenerative changes.  CT scan done.  No evidence of any bony abnormalities or degenerative changes.  No evidence of any bony lesions.  No evidence of cervical radiculopathy.  Treat as musculoskeletal cervical pain.  With prednisone and have her follow-up with primary care doctor.  She can continue to take the muscle relaxer.  Final Clinical Impression(s) / ED Diagnoses Final diagnoses:  Torticollis    Rx / DC Orders ED Discharge Orders          Ordered    predniSONE (DELTASONE) 10 MG tablet  Daily        12/21/22 1142              Fredia Sorrow, MD 12/21/22 1147

## 2022-12-21 NOTE — ED Notes (Signed)
Patient transported to CT 

## 2022-12-21 NOTE — ED Notes (Signed)
ED Provider at bedside. 

## 2022-12-21 NOTE — Discharge Instructions (Signed)
Make an appointment to follow-up with your regular doctor.  Take the prednisone to see if it helps the inflammation in the left lateral aspect of the neck.  CAT scan showed a lot of degenerative changes but no fractures no significant bony abnormalities.

## 2022-12-21 NOTE — ED Notes (Signed)
Pt verbalized understanding of discharge instructions. Opportunity for questions provided.   

## 2022-12-30 DIAGNOSIS — E7800 Pure hypercholesterolemia, unspecified: Secondary | ICD-10-CM | POA: Diagnosis not present

## 2022-12-30 DIAGNOSIS — R7303 Prediabetes: Secondary | ICD-10-CM | POA: Diagnosis not present

## 2022-12-30 DIAGNOSIS — I10 Essential (primary) hypertension: Secondary | ICD-10-CM | POA: Diagnosis not present

## 2023-01-06 DIAGNOSIS — M489 Spondylopathy, unspecified: Secondary | ICD-10-CM | POA: Diagnosis not present

## 2023-01-06 DIAGNOSIS — E7800 Pure hypercholesterolemia, unspecified: Secondary | ICD-10-CM | POA: Diagnosis not present

## 2023-01-06 DIAGNOSIS — I10 Essential (primary) hypertension: Secondary | ICD-10-CM | POA: Diagnosis not present

## 2023-01-06 DIAGNOSIS — R7303 Prediabetes: Secondary | ICD-10-CM | POA: Diagnosis not present

## 2023-01-20 DIAGNOSIS — G5603 Carpal tunnel syndrome, bilateral upper limbs: Secondary | ICD-10-CM | POA: Diagnosis not present

## 2023-05-02 DIAGNOSIS — E119 Type 2 diabetes mellitus without complications: Secondary | ICD-10-CM | POA: Diagnosis not present

## 2023-07-03 DIAGNOSIS — E559 Vitamin D deficiency, unspecified: Secondary | ICD-10-CM | POA: Diagnosis not present

## 2023-07-03 DIAGNOSIS — R7303 Prediabetes: Secondary | ICD-10-CM | POA: Diagnosis not present

## 2023-07-03 DIAGNOSIS — I10 Essential (primary) hypertension: Secondary | ICD-10-CM | POA: Diagnosis not present

## 2023-07-03 DIAGNOSIS — E7800 Pure hypercholesterolemia, unspecified: Secondary | ICD-10-CM | POA: Diagnosis not present

## 2023-07-03 DIAGNOSIS — R946 Abnormal results of thyroid function studies: Secondary | ICD-10-CM | POA: Diagnosis not present

## 2023-07-10 DIAGNOSIS — Z0000 Encounter for general adult medical examination without abnormal findings: Secondary | ICD-10-CM | POA: Diagnosis not present

## 2023-07-10 DIAGNOSIS — I10 Essential (primary) hypertension: Secondary | ICD-10-CM | POA: Diagnosis not present

## 2023-07-10 DIAGNOSIS — R7303 Prediabetes: Secondary | ICD-10-CM | POA: Diagnosis not present

## 2023-07-10 DIAGNOSIS — E7800 Pure hypercholesterolemia, unspecified: Secondary | ICD-10-CM | POA: Diagnosis not present

## 2023-09-03 DIAGNOSIS — Z01419 Encounter for gynecological examination (general) (routine) without abnormal findings: Secondary | ICD-10-CM | POA: Diagnosis not present

## 2023-09-03 DIAGNOSIS — Z1231 Encounter for screening mammogram for malignant neoplasm of breast: Secondary | ICD-10-CM | POA: Diagnosis not present

## 2023-09-16 DIAGNOSIS — L700 Acne vulgaris: Secondary | ICD-10-CM | POA: Diagnosis not present

## 2023-09-29 ENCOUNTER — Other Ambulatory Visit: Payer: Self-pay

## 2023-09-29 ENCOUNTER — Ambulatory Visit (HOSPITAL_COMMUNITY)
Admission: EM | Admit: 2023-09-29 | Discharge: 2023-09-29 | Disposition: A | Discharge status: Home / Self Care | Payer: BC Managed Care – PPO | Attending: Family Medicine | Admitting: Family Medicine

## 2023-09-29 ENCOUNTER — Encounter (HOSPITAL_COMMUNITY): Payer: Self-pay | Admitting: Emergency Medicine

## 2023-09-29 DIAGNOSIS — H1013 Acute atopic conjunctivitis, bilateral: Secondary | ICD-10-CM | POA: Diagnosis not present

## 2023-09-29 MED ORDER — METHYLPREDNISOLONE 4 MG PO TBPK: ORAL_TABLET | ORAL | 0 refills | Status: DC

## 2023-09-29 MED ORDER — LOTEPREDNOL ETABONATE 0.2 % OP SUSP: 1.0000 [drp] | Freq: Four times a day (QID) | OPHTHALMIC | 0 refills | Status: DC

## 2023-09-29 NOTE — ED Provider Notes (Signed)
[facility]    CSN: [id number] Arrival date & time: 09/29/23  0848      History   Chief Complaint Chief Complaint  Patient presents with   Eye Problem    HPI Stacey Fuller is a 62 y.o. female.    patient is been dealing with 2 weeks of worsening eye irritation, itchiness, redness and swelling. patient states that she has been using all the over-the-counter antihistamine drops without any relief.  Patient states that she feels like the eyes are getting worse and that it is difficult to do her job.  Patient denies any fever or chills.  Patient notes some tearing but denies any pus or drainage.  Patient has no other concerns at this time.  Patient denies any visual changes other than. tearing   Eye Problem   Past Medical History:  Diagnosis Date   Allergy    Hypertension     Patient Active Problem List   Diagnosis Date Noted   BRBPR (bright red blood per rectum) 10/03/2020    Past Surgical History:  Procedure Laterality Date   ABDOMINAL HYSTERECTOMY     CESAREAN SECTION     COLONOSCOPY Left 10/03/2020   Procedure: COLONOSCOPY;  Surgeon: Jeani Hawking, MD;  Location: [facility];  Service: Endoscopy;  Laterality: Left;    OB History   No obstetric history on file.      Home Medications    Prior to Admission medications   Medication Sig Start Date End Date Taking? Authorizing Provider  loteprednol (LOTEMAX) 0.2 % SUSP Place 1 drop into both eyes 4 (four) times daily. 09/29/23  Yes Brenton Grills, MD  methylPREDNISolone (MEDROL DOSEPAK) 4 MG TBPK tablet Follow instructions on package 09/29/23  Yes Brenton Grills, MD  amLODipine (NORVASC) 5 MG tablet Take 1 tablet (5 mg total) by mouth daily. 07/12/21   Horton, Mayer Masker, MD  OVER THE COUNTER MEDICATION Take 1 tablet by mouth daily.     [provider]  polyethylene glycol (MIRALAX) 17 g packet Take 17 g by mouth daily as needed. 10/04/20   Burnadette Pop, MD    Family History Family History   Adopted: Yes  Problem Relation Age of Onset   Cancer Sister        late 55s, breast cancer   Cancer Sister        early 35s, breast cancer    Social History Social History   Tobacco Use   Smoking status: Every Day    Current packs/day: 1.00    Average packs/day: 1 pack/day for 25.0 years (25.0 ttl pk-yrs)    Types: Cigarettes   Smokeless tobacco: Never   Tobacco comments:    25 PLUS years  Vaping Use   Vaping status: Never Used  Substance Use Topics   Alcohol use: Yes    Alcohol/week: 1.0 - 2.0 standard drink of alcohol    Types: 1 - 2 Standard drinks or equivalent per week   Drug use: Not Currently    Comment: marijuana      Allergies   Patient has no known allergies.   Review of Systems Review of Systems   Physical Exam Triage Vital Signs ED Triage Vitals  Encounter Vitals Group     BP      Systolic BP Percentile      Diastolic BP Percentile      Pulse      Resp      Temp      Temp src  SpO2      Weight      Height      Head Circumference      Peak Flow      Pain Score      Pain Loc      Pain Education      Exclude from Growth Chart    No data found.  Updated Vital Signs BP 118/79 (BP Location: Left Arm)   Pulse 66   Temp 97.6 F (36.4 C) (Oral)   Resp 18   SpO2 97%    Physical Exam Constitutional:      Appearance: Normal appearance.  Eyes:     General: Scleral icterus present.     Extraocular Movements: Extraocular movements intact.     Pupils: Pupils are equal, round, and reactive to light.     Comments:  bilateral conjunctivitis noted.  No pus or drainage. sensitivity to light appropriate. there is some swelling of the eyelids.  Neurological:     Mental Status: She is alert.      UC Treatments / Results  Labs (all labs ordered are listed, but only abnormal results are displayed) Labs Reviewed - No data to display  EKG   Radiology No results found.  Procedures Procedures (including critical care  time)  Medications Ordered in UC Medications - No data to display  Initial Impression / Assessment and Plan / UC Course  I have reviewed the triage vital signs and the nursing notes.  Pertinent labs & imaging results that were available during my care of the patient were reviewed by me and considered in my medical decision making (see chart for details).    Discussed with patient given patient's symptoms have worsened over the past 2 weeks, at this time patient to try oral Benadryl as she has not tried an oral antihistamine yet.  If the oral antihistamine does not work we will do topical steroid.  If still no improvement with topical steroid then patient will was given a Medrol Dosepak to start taking. Final Clinical Impressions(s) / UC Diagnoses   Final diagnoses:  Allergic conjunctivitis of both eyes     Discharge Instructions       please take oral Benadryl today to see if your symptoms improve.  Her symptoms not improve you can do warm compress over your eyes for some relief.  I would prefer you do the topical steroid before starting to take the oral steroid  If her symptoms continue to worsen you may take the Medrol Dosepak     ED Prescriptions     Medication Sig Dispense Auth. Provider   methylPREDNISolone (MEDROL DOSEPAK) 4 MG TBPK tablet Follow instructions on package 1 each Brenton Grills, MD   loteprednol (LOTEMAX) 0.2 % SUSP Place 1 drop into both eyes 4 (four) times daily. 10 mL Brenton Grills, MD      PDMP not reviewed this encounter.   Brenton Grills, MD 09/29/23 1038

## 2023-09-29 NOTE — Discharge Instructions (Addendum)
please take oral Benadryl today to see if your symptoms improve.  Her symptoms not improve you can do warm compress over your eyes for some relief.  I would prefer you do the topical steroid before starting to take the oral steroid  If her symptoms continue to worsen you may take the Medrol Dosepak

## 2023-09-29 NOTE — ED Triage Notes (Addendum)
Eyes are itching, tearing, burning for one week.  Has used pataday eye drops.  Eyes are very red  Patient does not wear contacts  Saw a provider 2 weeks ago for acne concern.

## 2023-10-08 ENCOUNTER — Encounter (HOSPITAL_COMMUNITY): Payer: Self-pay

## 2023-10-08 ENCOUNTER — Ambulatory Visit (HOSPITAL_COMMUNITY)
Admission: EM | Admit: 2023-10-08 | Discharge: 2023-10-08 | Disposition: A | Discharge status: Home / Self Care | Payer: BC Managed Care – PPO | Attending: Emergency Medicine | Admitting: Emergency Medicine

## 2023-10-08 DIAGNOSIS — J069 Acute upper respiratory infection, unspecified: Secondary | ICD-10-CM | POA: Diagnosis not present

## 2023-10-08 MED ORDER — PROMETHAZINE-DM 6.25-15 MG/5ML PO SYRP: 5.0000 mL | ORAL_SOLUTION | Freq: Four times a day (QID) | ORAL | 0 refills | Status: DC | PRN

## 2023-10-08 MED ORDER — BENZONATATE 200 MG PO CAPS: 200.0000 mg | ORAL_CAPSULE | Freq: Three times a day (TID) | ORAL | 0 refills | Status: DC

## 2023-10-08 NOTE — ED Provider Notes (Signed)
[facility]    CSN: [id number] Arrival date & time: 10/08/23  0808      History   Chief Complaint Chief Complaint  Patient presents with   Cough    HPI Stacey Fuller is a 62 y.o. female.   Patient presents to clinic complaining of a dry cough that has been present for the past 4 days.  Reports intermittently she will cough of yellow mucus.  She is having right sided ear pain, sore throat and nasal congestion as well.  Has been using TheraFlu at home without much relief.  She denies any fevers.  She did take a COVID-19 test at work yesterday and it was negative.  Denies any wheezing, shortness of breath or chest pain.  She does not have any history of asthma or respiratory illnesses.  She does smoke daily.   The history is provided by the patient and medical records.  Cough   Past Medical History:  Diagnosis Date   Allergy    Hypertension     Patient Active Problem List   Diagnosis Date Noted   BRBPR (bright red blood per rectum) 10/03/2020    Past Surgical History:  Procedure Laterality Date   ABDOMINAL HYSTERECTOMY     CESAREAN SECTION     COLONOSCOPY Left 10/03/2020   Procedure: COLONOSCOPY;  Surgeon: Jeani Hawking, MD;  Location: [facility];  Service: Endoscopy;  Laterality: Left;    OB History   No obstetric history on file.      Home Medications    Prior to Admission medications   Medication Sig Start Date End Date Taking? Authorizing Provider  benzonatate (TESSALON) 200 MG capsule Take 1 capsule (200 mg total) by mouth every 8 (eight) hours. 10/08/23  Yes Rinaldo Ratel, [place] N, FNP  promethazine-dextromethorphan (PROMETHAZINE-DM) 6.25-15 MG/5ML syrup Take 5 mLs by mouth 4 (four) times daily as needed for cough. 10/08/23  Yes Rinaldo Ratel, [place] N, FNP  amLODipine (NORVASC) 5 MG tablet Take 1 tablet (5 mg total) by mouth daily. 07/12/21   Horton, Mayer Masker, MD  loteprednol (LOTEMAX) 0.2 % SUSP Place 1 drop into both eyes 4 (four) times daily.  09/29/23   Brenton Grills, MD  methylPREDNISolone (MEDROL DOSEPAK) 4 MG TBPK tablet Follow instructions on package 09/29/23   Brenton Grills, MD  OVER THE COUNTER MEDICATION Take 1 tablet by mouth daily.     [provider]  polyethylene glycol (MIRALAX) 17 g packet Take 17 g by mouth daily as needed. 10/04/20   Burnadette Pop, MD    Family History Family History  Adopted: Yes  Problem Relation Age of Onset   Cancer Sister        late 15s, breast cancer   Cancer Sister        early 18s, breast cancer    Social History Social History   Tobacco Use   Smoking status: Every Day    Current packs/day: 1.00    Average packs/day: 1 pack/day for 25.0 years (25.0 ttl pk-yrs)    Types: Cigarettes   Smokeless tobacco: Never   Tobacco comments:    25 PLUS years  Vaping Use   Vaping status: Never Used  Substance Use Topics   Alcohol use: Yes    Alcohol/week: 1.0 - 2.0 standard drink of alcohol    Types: 1 - 2 Standard drinks or equivalent per week   Drug use: Not Currently    Comment: marijuana      Allergies   Patient has no known  allergies.   Review of Systems Review of Systems  Per HPI   Physical Exam Triage Vital Signs ED Triage Vitals  Encounter Vitals Group     BP 10/08/23 0827 129/81     Systolic BP Percentile --      Diastolic BP Percentile --      Pulse Rate 10/08/23 0827 75     Resp 10/08/23 0827 16     Temp 10/08/23 0827 98.2 F (36.8 C)     Temp Source 10/08/23 0827 Oral     SpO2 10/08/23 0827 96 %     Weight --      Height --      Head Circumference --      Peak Flow --      Pain Score 10/08/23 0828 0     Pain Loc --      Pain Education --      Exclude from Growth Chart --    No data found.  Updated Vital Signs BP 129/81 (BP Location: Left Arm)   Pulse 75   Temp 98.2 F (36.8 C) (Oral)   Resp 16   SpO2 96%   Visual Acuity Right Eye Distance:   Left Eye Distance:   Bilateral Distance:    Right Eye Near:   Left Eye Near:     Bilateral Near:     Physical Exam Vitals and nursing note reviewed.  Constitutional:      Appearance: Normal appearance.  HENT:     Head: Normocephalic and atraumatic.     Right Ear: Tympanic membrane, ear canal and external ear normal.     Left Ear: Tympanic membrane, ear canal and external ear normal.     Nose: Congestion and rhinorrhea present.     Mouth/Throat:     Mouth: Mucous membranes are moist.     Pharynx: Posterior oropharyngeal erythema present.  Eyes:     Conjunctiva/sclera: Conjunctivae normal.  Cardiovascular:     Rate and Rhythm: Normal rate and regular rhythm.     Heart sounds: Normal heart sounds. No murmur heard. Pulmonary:     Effort: Pulmonary effort is normal. No respiratory distress.     Breath sounds: Normal breath sounds.  Musculoskeletal:        General: Normal range of motion.  Skin:    General: Skin is warm and dry.  Neurological:     General: No focal deficit present.     Mental Status: She is alert and oriented to person, place, and time.  Psychiatric:        Mood and Affect: Mood normal.        Behavior: Behavior normal.      UC Treatments / Results  Labs (all labs ordered are listed, but only abnormal results are displayed) Labs Reviewed - No data to display  EKG   Radiology No results found.  Procedures Procedures (including critical care time)  Medications Ordered in UC Medications - No data to display  Initial Impression / Assessment and Plan / UC Course  I have reviewed the triage vital signs and the nursing notes.  Pertinent labs & imaging results that were available during my care of the patient were reviewed by me and considered in my medical decision making (see chart for details).  Vitals and triage reviewed, patient is hemodynamically stable.  Lungs are vesicular, heart with regular rate and rhythm.  Symptoms consistent with viral URI, COVID-19 testing at home negative.  No fevers.  Symptomatic management for viral  URI symptoms discussed.  Return precautions given, no questions at this time.     Final Clinical Impressions(s) / UC Diagnoses   Final diagnoses:  Viral URI with cough     Discharge Instructions      Your symptoms are consistent with a viral upper respiratory tract infection.  You can sleep with a humidifier to help loosen your secretions, do warm saline gargles and drink tea with honey to help your sore throat.  You can take 1200 mg of Mucinex daily in addition to at least 64 ounces of water to help loosen your secretions.  You can use the Tessalon Perles throughout the day and the promethazine syrup before bedtime.  Do not drink or drive on the cough syrup as it may cause drowsiness.  Symptoms should improve over the next 3 to 5 days, if no improvement or any changes please return to clinic.      ED Prescriptions     Medication Sig Dispense Auth. Provider   promethazine-dextromethorphan (PROMETHAZINE-DM) 6.25-15 MG/5ML syrup Take 5 mLs by mouth 4 (four) times daily as needed for cough. 118 mL Rinaldo Ratel, [place] N, [place]   benzonatate (TESSALON) 200 MG capsule Take 1 capsule (200 mg total) by mouth every 8 (eight) hours. 30 capsule Mylin Hirano, [place] N, [place]      PDMP not reviewed this encounter.   Rinaldo Ratel [place] N, [place] 10/08/23 [phone]

## 2023-10-08 NOTE — Discharge Instructions (Addendum)
Your symptoms are consistent with a viral upper respiratory tract infection.  You can sleep with a humidifier to help loosen your secretions, do warm saline gargles and drink tea with honey to help your sore throat.  You can take 1200 mg of Mucinex daily in addition to at least 64 ounces of water to help loosen your secretions.  You can use the Tessalon Perles throughout the day and the promethazine syrup before bedtime.  Do not drink or drive on the cough syrup as it may cause drowsiness.  Symptoms should improve over the next 3 to 5 days, if no improvement or any changes please return to clinic.

## 2023-10-08 NOTE — ED Triage Notes (Signed)
Pt states cough for the past 4 days.  States she is coughing up yellow mucous. States she has been taking theraflu at home.

## 2023-10-08 NOTE — ED Notes (Signed)
Reviewed safety precautions with prescribed medicine

## 2023-10-16 DIAGNOSIS — H01131 Eczematous dermatitis of right upper eyelid: Secondary | ICD-10-CM | POA: Diagnosis not present

## 2024-03-28 ENCOUNTER — Encounter (HOSPITAL_COMMUNITY): Payer: Self-pay | Admitting: Emergency Medicine

## 2024-03-28 ENCOUNTER — Ambulatory Visit (HOSPITAL_COMMUNITY)
Admission: EM | Admit: 2024-03-28 | Discharge: 2024-03-28 | Disposition: A | Discharge status: Home / Self Care | Attending: Physician Assistant | Admitting: Physician Assistant

## 2024-03-28 DIAGNOSIS — J40 Bronchitis, not specified as acute or chronic: Secondary | ICD-10-CM | POA: Diagnosis not present

## 2024-03-28 DIAGNOSIS — J329 Chronic sinusitis, unspecified: Secondary | ICD-10-CM | POA: Diagnosis not present

## 2024-03-28 MED ORDER — PREDNISONE 20 MG PO TABS: 40.0000 mg | ORAL_TABLET | Freq: Every day | ORAL | 0 refills | Status: AC

## 2024-03-28 MED ORDER — PROMETHAZINE-DM 6.25-15 MG/5ML PO SYRP: 5.0000 mL | ORAL_SOLUTION | Freq: Four times a day (QID) | ORAL | 0 refills | Status: AC | PRN

## 2024-03-28 MED ORDER — AMOXICILLIN-POT CLAVULANATE 875-125 MG PO TABS: 1.0000 | ORAL_TABLET | Freq: Two times a day (BID) | ORAL | 0 refills | Status: AC

## 2024-03-28 NOTE — ED Provider Notes (Signed)
 [facility]    CSN: [id number] Arrival date & time: 03/28/24  1003      History   Chief Complaint Chief Complaint  Patient presents with   Cough   Nasal Congestion    HPI Stacey Fuller is a 63 y.o. female.   Patient here today for evaluation of cough and congestion she has had for the last week and a half.  She reports right ear discomfort as well and postnasal drip.  She states her mucus is yellowish in color.  She has not taken any medication for symptoms.  She has not had fever.  The history is provided by the patient.  Cough Associated symptoms: ear pain and sore throat   Associated symptoms: no chills, no eye discharge, no fever, no shortness of breath and no wheezing     Past Medical History:  Diagnosis Date   Allergy    Hypertension     Patient Active Problem List   Diagnosis Date Noted   BRBPR (bright red blood per rectum) 10/03/2020    Past Surgical History:  Procedure Laterality Date   ABDOMINAL HYSTERECTOMY     CESAREAN SECTION     COLONOSCOPY Left 10/03/2020   Procedure: COLONOSCOPY;  Surgeon: Alvis Jourdain, MD;  Location: [facility];  Service: Endoscopy;  Laterality: Left;    OB History   No obstetric history on file.      Home Medications    Prior to Admission medications   Medication Sig Start Date End Date Taking? Authorizing Provider  amoxicillin -clavulanate (AUGMENTIN ) 875-125 MG tablet Take 1 tablet by mouth every 12 (twelve) hours. 03/28/24  Yes Vernestine Gondola, PA-C  predniSONE  (DELTASONE ) 20 MG tablet Take 2 tablets (40 mg total) by mouth daily with breakfast for 5 days. 03/28/24 04/02/24 Yes Vernestine Gondola, PA-C  amLODipine  (NORVASC ) 5 MG tablet Take 1 tablet (5 mg total) by mouth daily. 07/12/21   Horton, Vonzella [place], MD  OVER THE COUNTER MEDICATION Take 1 tablet by mouth daily.     [provider]  promethazine -dextromethorphan (PROMETHAZINE -DM) 6.25-15 MG/5ML syrup Take 5 mLs by mouth 4 (four) times daily as  needed for cough. 03/28/24   Vernestine Gondola, PA-C    Family History Family History  Adopted: Yes  Problem Relation Age of Onset   Cancer Sister        late 71s, breast cancer   Cancer Sister        early 74s, breast cancer    Social History Social History   Tobacco Use   Smoking status: Every Day    Current packs/day: 1.00    Average packs/day: 1 pack/day for 25.0 years (25.0 ttl pk-yrs)    Types: Cigarettes   Smokeless tobacco: Never   Tobacco comments:    25 PLUS years  Vaping Use   Vaping status: Never Used  Substance Use Topics   Alcohol use: Yes    Alcohol/week: 1.0 - 2.0 standard drink of alcohol    Types: 1 - 2 Standard drinks or equivalent per week   Drug use: Not Currently    Comment: marijuana      Allergies   Patient has no known allergies.   Review of Systems Review of Systems  Constitutional:  Negative for chills and fever.  HENT:  Positive for congestion, ear pain and sore throat.   Eyes:  Negative for discharge and redness.  Respiratory:  Positive for cough. Negative for shortness of breath and wheezing.   Gastrointestinal:  Negative for abdominal pain, diarrhea, nausea and vomiting.  Genitourinary:  Positive for vaginal bleeding and vaginal discharge.     Physical Exam Triage Vital Signs ED Triage Vitals  Encounter Vitals Group     BP 03/28/24 1027 135/72     Systolic BP Percentile --      Diastolic BP Percentile --      Pulse Rate 03/28/24 1027 69     Resp 03/28/24 1027 17     Temp 03/28/24 1027 97.9 F (36.6 C)     Temp Source 03/28/24 1027 Oral     SpO2 03/28/24 1027 97 %     Weight --      Height --      Head Circumference --      Peak Flow --      Pain Score 03/28/24 1026 0     Pain Loc --      Pain Education --      Exclude from Growth Chart --    No data found.  Updated Vital Signs BP 135/72 (BP Location: Left Arm)   Pulse 69   Temp 97.9 F (36.6 C) (Oral)   Resp 17   SpO2 97%   Visual Acuity Right Eye  Distance:   Left Eye Distance:   Bilateral Distance:    Right Eye Near:   Left Eye Near:    Bilateral Near:     Physical Exam Vitals and nursing note reviewed.  Constitutional:      General: She is not in acute distress.    Appearance: Normal appearance. She is not ill-appearing.  HENT:     Head: Normocephalic and atraumatic.     Right Ear: Tympanic membrane normal.     Left Ear: Tympanic membrane normal.     Nose: Congestion present.     Mouth/Throat:     Mouth: Mucous membranes are moist.     Pharynx: No oropharyngeal exudate or posterior oropharyngeal erythema.  Eyes:     Conjunctiva/sclera: Conjunctivae normal.  Cardiovascular:     Rate and Rhythm: Normal rate and regular rhythm.     Heart sounds: Normal heart sounds. No murmur heard. Pulmonary:     Effort: Pulmonary effort is normal. No respiratory distress.     Breath sounds: Normal breath sounds. No wheezing, rhonchi or rales.  Skin:    General: Skin is warm and dry.  Neurological:     Mental Status: She is alert.  Psychiatric:        Mood and Affect: Mood normal.        Behavior: Behavior normal.        Thought Content: Thought content normal.      UC Treatments / Results  Labs (all labs ordered are listed, but only abnormal results are displayed) Labs Reviewed - No data to display  EKG   Radiology No results found.  Procedures Procedures (including critical care time)  Medications Ordered in UC Medications - No data to display  Initial Impression / Assessment and Plan / UC Course  I have reviewed the triage vital signs and the nursing notes.  Pertinent labs & imaging results that were available during my care of the patient were reviewed by me and considered in my medical decision making (see chart for details).    Will treat to cover sinobronchitis with steroid burst as well as Augmentin .  Cough syrup prescribed for hopeful symptom relief.  Advised follow-up if no gradual improvement with any  worsening symptoms.  Final Clinical  Impressions(s) / UC Diagnoses   Final diagnoses:  Sinobronchitis   Discharge Instructions   None    ED Prescriptions     Medication Sig Dispense Auth. Provider   promethazine -dextromethorphan (PROMETHAZINE -DM) 6.25-15 MG/5ML syrup Take 5 mLs by mouth 4 (four) times daily as needed for cough. 118 mL Jami Mcclintock F, PA-C   predniSONE  (DELTASONE ) 20 MG tablet Take 2 tablets (40 mg total) by mouth daily with breakfast for 5 days. 10 tablet Vernestine Gondola, PA-C   amoxicillin -clavulanate (AUGMENTIN ) 875-125 MG tablet Take 1 tablet by mouth every 12 (twelve) hours. 14 tablet Vernestine Gondola, PA-C      PDMP not reviewed this encounter.   Vernestine Gondola, PA-C 03/28/24 1040

## 2024-03-28 NOTE — ED Triage Notes (Signed)
 Pt c/o cough and congestion for week and half. Hasn't taken any medications for her symptoms.
